# Patient Record
Sex: Male | Born: 2005 | Race: White | Hispanic: No | Marital: Single | Smoking: Never smoker
Health system: Southern US, Community
[De-identification: ages and names within clinical notes are randomized; demographics above are authoritative.]

## PROBLEM LIST (undated history)

## (undated) DIAGNOSIS — F89 Unspecified disorder of psychological development: Secondary | ICD-10-CM

## (undated) DIAGNOSIS — J45909 Unspecified asthma, uncomplicated: Secondary | ICD-10-CM

## (undated) DIAGNOSIS — G4700 Insomnia, unspecified: Secondary | ICD-10-CM

## (undated) DIAGNOSIS — T7840XA Allergy, unspecified, initial encounter: Secondary | ICD-10-CM

## (undated) DIAGNOSIS — F845 Asperger's syndrome: Secondary | ICD-10-CM

## (undated) HISTORY — DX: Allergy, unspecified, initial encounter: T78.40XA

## (undated) HISTORY — DX: Asperger's syndrome: F84.5

## (undated) HISTORY — DX: Insomnia, unspecified: G47.00

## (undated) HISTORY — DX: Unspecified disorder of psychological development: F89

## (undated) HISTORY — DX: Unspecified asthma, uncomplicated: J45.909

---
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
## 2023-02-02 ENCOUNTER — Other Ambulatory Visit: Payer: Self-pay

## 2023-02-02 ENCOUNTER — Encounter: Payer: Self-pay | Admitting: Emergency Medicine

## 2023-02-02 ENCOUNTER — Ambulatory Visit
Admission: EM | Admit: 2023-02-02 | Discharge: 2023-02-02 | Disposition: A | Discharge status: Home / Self Care | Payer: MEDICAID | Attending: Internal Medicine | Admitting: Internal Medicine

## 2023-02-02 DIAGNOSIS — R051 Acute cough: Secondary | ICD-10-CM | POA: Diagnosis not present

## 2023-02-02 DIAGNOSIS — J069 Acute upper respiratory infection, unspecified: Secondary | ICD-10-CM | POA: Diagnosis not present

## 2023-02-02 MED ORDER — AMOXICILLIN-POT CLAVULANATE 875-125 MG PO TABS: 1.0000 | ORAL_TABLET | Freq: Two times a day (BID) | ORAL | 0 refills | Status: DC

## 2023-02-02 NOTE — ED Triage Notes (Signed)
Pt here for nasal congestion, cough x 1 week

## 2023-02-02 NOTE — ED Provider Notes (Signed)
[facility]    CSN: [id number] Arrival date & time: 02/02/23  1126      History   Chief Complaint Chief Complaint  Patient presents with   Nasal Congestion    HPI Jesse Howe is a 17 y.o. male.   Patient presents with approximately 8-day history of nasal congestion and coughing.  Patient has taken Tylenol and allergy medicine for symptoms with minimal improvement.  His grandfather has similar symptoms.  Denies any fever.  Denies chest pain or shortness of breath.     Past Medical History:  Diagnosis Date   Allergy    RHINITIS   Asperger syndrome    sees psychiatry and counseling   Asthma    Asthma    Phreesia 08/16/2020   Developmental disorder    Insomnia     Patient Active Problem List   Diagnosis Date Noted   Rupture of anterior cruciate ligament of right knee    Acute lateral meniscus tear of right knee    Complete tear of right ACL, initial encounter 03/18/2021   Acute medial meniscus tear of right knee 03/18/2021   Abnormal finding on EKG 06/19/2019   Need for prophylactic vaccination and inoculation against influenza 08/09/2018   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   Rhinitis, allergic 02/21/2015   Asthma, moderate persistent 02/21/2015   Follow up 02/21/2015   Need for HPV vaccination 02/21/2015   Vaccine counseling 02/21/2015   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 Authorizing Provider  amoxicillin-clavulanate (AUGMENTIN) 875-125 MG tablet Take 1 tablet by mouth every 12 (twelve) hours. 02/02/23  Yes Ivie Maese, Acie Fredrickson, FNP  albuterol (PROAIR HFA) 108 (90 Base) MCG/ACT inhaler Inhale 2 puffs into the lungs every 6 (six) hours as needed for wheezing or shortness of breath. 08/29/21   Myles Gip, DO  albuterol (PROVENTIL) (2.5 MG/3ML) 0.083% nebulizer solution Take 3 mLs (2.5 mg total) by nebulization every 6 (six) hours as needed for wheezing or shortness of breath. 08/02/18   Myles Gip, DO  albuterol (VENTOLIN HFA) 108 (90 Base) MCG/ACT inhaler Inhale 2 puffs into the lungs every 6 (six) hours as needed for wheezing or shortness of breath. 06/19/19   Klett, Pascal Lux, NP  Carbinoxamine Maleate ER [facility]) 4 MG/5ML SUER Take 10 mLs by mouth 2 (two) times daily as needed. 02/17/21   Estelle June, NP  cetirizine (ZYRTEC) 10 MG tablet Take 1 tablet (10 mg total) by mouth at bedtime. Patient not taking: Reported on 08/02/2018 08/27/17   Tysinger, Kermit Balo, PA-C  fluticasone [facility]) 50 MCG/ACT nasal spray Place 2 sprays into both nostrils daily. 08/29/21   Myles Gip, DO  HYDROcodone-acetaminophen (NORCO/VICODIN) 5-325 MG tablet Take 1 tablet by mouth every 12 (twelve) hours as needed for moderate pain. Patient not taking: Reported on 06/22/2022 04/28/21   Cammy Copa, MD  methocarbamol (ROBAXIN) 500 MG tablet Take 1 tablet (500 mg total) by  mouth every 8 (eight) hours as needed. Patient not taking: Reported on 06/22/2022 04/07/21   Magnant, Joycie Peek, PA-C  Multiple Vitamin (MULTIVITAMIN) tablet Take 1 tablet by mouth daily. Reported on 09/30/2015    [provider]  Spacer/Aero-Holding Chambers (AEROCHAMBER PLUS) inhaler Use as instructed with inhaler use 08/10/18   Agbuya, Ines Bloomer, DO    Family History Family History  Problem Relation Age of Onset   Arthritis Paternal  Grandmother    Diabetes Paternal Grandmother    Depression Paternal Grandmother    Arthritis Paternal Grandfather    Diabetes Paternal Grandfather    Hypertension Paternal Grandfather    Arthritis Mother        rheumatoid   HIV Maternal Grandfather        Died in his 65's    Social History Social History   Tobacco Use   Smoking status: Never   Smokeless tobacco: Never  Substance Use Topics   Alcohol use: No   Drug use: No     Allergies   Patient has no known allergies.   Review of Systems Review of Systems Per HPI  Physical Exam Triage Vital Signs ED Triage Vitals [02/02/23 1138]  Encounter Vitals Group     BP 121/68     Systolic BP Percentile      Diastolic BP Percentile      Pulse Rate 64     Resp 18     Temp 97.6 F (36.4 C)     Temp Source Oral     SpO2 96 %     Weight      Height      Head Circumference      Peak Flow      Pain Score 0     Pain Loc      Pain Education      Exclude from Growth Chart    No data found.  Updated Vital Signs BP 121/68 (BP Location: Right Arm)   Pulse 64   Temp 97.6 F (36.4 C) (Oral)   Resp 18   SpO2 96%   Visual Acuity Right Eye Distance:   Left Eye Distance:   Bilateral Distance:    Right Eye Near:   Left Eye Near:    Bilateral Near:     Physical Exam Constitutional:      General: He is not in acute distress.    Appearance: Normal appearance. He is not toxic-appearing or diaphoretic.  HENT:     Head: Normocephalic and atraumatic.     Right Ear: Tympanic membrane and ear canal normal.     Left Ear: Tympanic membrane and ear canal normal.     Nose: Congestion present.     Mouth/Throat:     Mouth: Mucous membranes are moist.     Pharynx: No posterior oropharyngeal erythema.  Eyes:     Extraocular Movements: Extraocular movements intact.     Conjunctiva/sclera: Conjunctivae normal.     Pupils: Pupils are equal, round, and reactive to light.  Cardiovascular:     Rate and Rhythm: Normal rate and  regular rhythm.     Pulses: Normal pulses.     Heart sounds: Normal heart sounds.  Pulmonary:     Effort: Pulmonary effort is normal. No respiratory distress.     Breath sounds: Normal breath sounds. No stridor. No wheezing, rhonchi or rales.  Abdominal:     Palpations: Abdomen is soft.  Musculoskeletal:        General: Normal range of  motion.     Cervical back: Normal range of motion.  Skin:    General: Skin is warm and dry.  Neurological:     General: No focal deficit present.     Mental Status: He is alert and oriented to person, place, and time. Mental status is at baseline.  Psychiatric:        Mood and Affect: Mood normal.        Behavior: Behavior normal.      UC Treatments / Results  Labs (all labs ordered are listed, but only abnormal results are displayed) Labs Reviewed - No data to display  EKG   Radiology No results found.  Procedures Procedures (including critical care time)  Medications Ordered in UC Medications - No data to display  Initial Impression / Assessment and Plan / UC Course  I have reviewed the triage vital signs and the nursing notes.  Pertinent labs & imaging results that were available during my care of the patient were reviewed by me and considered in my medical decision making (see chart for details).     Symptoms most likely started off as a viral illness, but given duration of symptoms, I am concerned for secondary bacterial infection versus sinus infection.  Will opt to treat with Augmentin antibiotic today.  Advised supportive care and symptom management.  Advised strict follow-up precautions.  Patient verbalized understanding and was agreeable with plan.  Patient denies that he has a legal guardian. Final Clinical Impressions(s) / UC Diagnoses   Final diagnoses:  Acute upper respiratory infection  Acute cough     Discharge Instructions      I have sent you an antibiotic to treat upper respiratory infection.  Please  follow-up if any symptoms persist or worsen.    ED Prescriptions     Medication Sig Dispense Auth. Provider   amoxicillin-clavulanate (AUGMENTIN) 875-125 MG tablet Take 1 tablet by mouth every 12 (twelve) hours. 14 tablet [place], Acie Fredrickson, [place]      PDMP not reviewed this encounter.   Gustavus Bryant, [place] 02/02/23 1219

## 2023-02-02 NOTE — Discharge Instructions (Signed)
I have sent you an antibiotic to treat upper respiratory infection.  Please follow-up if any symptoms persist or worsen.

## 2023-02-04 ENCOUNTER — Ambulatory Visit: Payer: Medicaid Other | Admitting: Medical

## 2023-03-23 ENCOUNTER — Encounter: Payer: Self-pay | Admitting: Family Medicine

## 2023-03-23 ENCOUNTER — Ambulatory Visit: Payer: MEDICAID | Admitting: Family Medicine

## 2023-03-23 VITALS — BP 110/70 | HR 70 | Ht 74.0 in | Wt 166.8 lb

## 2023-03-23 DIAGNOSIS — M25462 Effusion, left knee: Secondary | ICD-10-CM | POA: Diagnosis not present

## 2023-03-23 DIAGNOSIS — R29898 Other symptoms and signs involving the musculoskeletal system: Secondary | ICD-10-CM | POA: Diagnosis not present

## 2023-03-23 NOTE — Progress Notes (Signed)
   Subjective:    Patient ID: Jesse Howe, male    DOB: 2005-09-02, 17 y.o.   MRN: [id number]  HPI He states that yesterday while playing basketball, he came down and felt a popping sensation and feeling as if his leg gave out laterally.  He did not play after that.  It did swell up.   Review of Systems     Objective:    Physical Exam Exam of the left knee does show an effusion.  No joint line tenderness noted.  Positive anterior drawer.  Lateral collateral ligament was slightly lax.  McMurray's testing negative.       Assessment & Plan:  Effusion of left knee - Plan: Ambulatory referral to Orthopedic Surgery  Positive anterior drawer test of knee joint - Plan: Ambulatory referral to Orthopedic Surgery I explained that since he had an acute effusion with positive anterior drawer and possible lateral collateral ligament damage, referral to orthopedics is appropriate.  He has seen Dr. August Saucer in the past for previous ACL repair on the right.  Recommend 2 Aleve twice per day and I will give him a note for out of work for the next 2 weeks.

## 2023-03-23 NOTE — Patient Instructions (Signed)
2 Aleve twice per day.  Ice for 20 minutes 3 times per day

## 2023-03-24 ENCOUNTER — Other Ambulatory Visit (INDEPENDENT_AMBULATORY_CARE_PROVIDER_SITE_OTHER): Payer: MEDICAID

## 2023-03-24 ENCOUNTER — Encounter: Payer: Self-pay | Admitting: Surgical

## 2023-03-24 ENCOUNTER — Ambulatory Visit: Payer: MEDICAID | Admitting: Surgical

## 2023-03-24 DIAGNOSIS — M25562 Pain in left knee: Secondary | ICD-10-CM

## 2023-03-24 DIAGNOSIS — M25462 Effusion, left knee: Secondary | ICD-10-CM | POA: Diagnosis not present

## 2023-03-24 NOTE — Progress Notes (Signed)
Office Visit Note   Patient: Jesse Howe           Date of Birth: 06-Jul-2005           MRN: [id number] Visit Date: 03/24/2023 Requested by: Ronnald Nian, MD [address] [place],  [place] [address] PCP: Ronnald Nian, MD  Subjective: Chief Complaint  Patient presents with   Left Knee - Pain    HPI: Jesse Howe is a 17 y.o. male who presents to the office reporting left knee pain.  Patient states that he was playing basketball 2 days ago.  He jumped up to grab a rebound and when he landed he landed only on his left leg initially.  He felt a pop and immediate pain primarily in the posterior medial aspect of the left knee.  Has never had issues with this knee before.  This pain caused him to sit out the rest of the game and he did not play basketball again.  Did not have any swelling at the time but did note some swelling the following morning.  States that he has been able to ambulate without too much difficulty though he is walking with a bit of a limp.  Does have some symptomatic instability and some stiffness.  Pain does not keep him up at night.  Not taking any medications for pain aside from Tylenol.  No history of prior surgery to this knee though he has had ACL reconstruction with quadricep autograft on the right knee.  He feels that the right knee is actually in a pretty good place after having a little bit of issues with it back in May 2024.  No mechanical symptoms in the knee..                ROS: All systems reviewed are negative as they relate to the chief complaint within the history of present illness.  Patient denies fevers or chills.  Assessment & Plan: Visit Diagnoses:  1. Effusion, left knee   2. Acute pain of left knee     Plan: Patient is a 17 year old male who presents for evaluation of left knee pain.  Has history of right knee ACL reconstruction with quadricep autograft several years ago.  This has been doing pretty well for him in the last several  months especially.  He played basketball 2 days ago and landed awkwardly on his left leg feeling a pop and now has a fairly large effusion.  Main differential diagnosis would be ACL tear as he had on his contralateral side or meniscal tear especially with the delayed onset of swelling that he did not notice until the next day.  Radiographs are negative for any significant abnormality.  Plan for further evaluation with MRI of the left knee and follow-up after MRI to review results with Dr. August Saucer.  Follow-Up Instructions: No follow-ups on file.   Orders:  Orders Placed This Encounter  Procedures   XR KNEE 3 VIEW LEFT   MR Knee Left w/o contrast   No orders of the defined types were placed in this encounter.     Procedures: No procedures performed   Clinical Data: No additional findings.  Objective: Vital Signs: There were no vitals taken for this visit.  Physical Exam:  Constitutional: Patient appears well-developed HEENT:  Head: Normocephalic Eyes:EOM are normal Neck: Normal range of motion Cardiovascular: Normal rate Pulmonary/chest: Effort normal Neurologic: Patient is alert Skin: Skin is warm Psychiatric: Patient has normal mood and affect  Ortho Exam: Ortho exam demonstrates left knee with moderate effusion.  No cellulitis or skin changes noted.  No calf tenderness.  Negative Homans' sign.  Able to perform straight leg raise without extensor lag.  He has mild to moderate tenderness over the medial joint line with positive McMurray's sign.  No tenderness over the lateral joint line.  No patellofemoral crepitus.  Has positive endpoint with anterior drawer and posterior drawer.  No laxity to MCL or LCL at 0 or 30 degrees.  Does have some mild laxity of the ACL on Lachman exam though there is still what feels to be an endpoint.  Patient guards a little bit through exam but overall tolerates exam pretty well.  He has range of motion from 0 degrees to 90 degrees comfortably and has  increased pain around 110 degrees of knee flexion.  Specialty Comments:  No specialty comments available.  Imaging: No results found.   PMFS History: Patient Active Problem List   Diagnosis Date Noted   Rupture of anterior cruciate ligament of right knee    Acute lateral meniscus tear of right knee    Complete tear of right ACL, initial encounter 03/18/2021   Acute medial meniscus tear of right knee 03/18/2021   Abnormal finding on EKG 06/19/2019   Need for prophylactic vaccination and inoculation against influenza 08/09/2018   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   Rhinitis, allergic 02/21/2015   Asthma, moderate persistent 02/21/2015   Encounter for follow-up 02/21/2015   Need for HPV vaccination 02/21/2015   Vaccine counseling 02/21/2015   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, requiring support (level 1) 12/07/2013   Developmental disorder 05/11/2013   Past Medical History:  Diagnosis Date   Allergy    RHINITIS   Asperger syndrome    sees psychiatry and counseling   Asthma    Asthma    Phreesia 08/16/2020   Developmental disorder    Insomnia     Family History  Problem Relation Age of Onset   Arthritis Paternal Grandmother    Diabetes Paternal Grandmother    Depression Paternal Grandmother    Arthritis Paternal Grandfather    Diabetes Paternal Grandfather    Hypertension Paternal Grandfather    Arthritis Mother        rheumatoid   HIV Maternal Grandfather        Died in his 32's    Past Surgical History:  Procedure Laterality Date   ANTERIOR CRUCIATE LIGAMENT REPAIR Right 04/07/2021   Procedure: RIGHT KNEE RECONSTRUCTION ANTERIOR CRUCIATE LIGAMENT (ACL) WITH QUADRICEPS AUTOGRAFT, LATERAL MENISCAL DEBRIDEMENT;  Surgeon: Cammy Copa, MD;  Location: [facility];  Service: Orthopedics;  Laterality: Right;   TONSILLECTOMY AND ADENOIDECTOMY Bilateral March 2011   Social History   Occupational History    Not on file  Tobacco Use   Smoking status: Never   Smokeless tobacco: Never  Substance and Sexual Activity   Alcohol use: No   Drug use: No   Sexual activity: Not on file

## 2023-03-29 ENCOUNTER — Ambulatory Visit
Admission: RE | Admit: 2023-03-29 | Discharge: 2023-03-29 | Disposition: A | Discharge status: Home / Self Care | Payer: MEDICAID | Source: Ambulatory Visit | Attending: Surgical

## 2023-03-29 DIAGNOSIS — M25562 Pain in left knee: Secondary | ICD-10-CM

## 2023-03-31 NOTE — Progress Notes (Signed)
Called and scheduled for 04/14/2023 at 10:30am.

## 2023-03-31 NOTE — Progress Notes (Signed)
Jesse Howe, can we have Jesse Howe come back to see Dr. August Saucer?

## 2023-04-14 ENCOUNTER — Ambulatory Visit (INDEPENDENT_AMBULATORY_CARE_PROVIDER_SITE_OTHER): Payer: MEDICAID | Admitting: Orthopedic Surgery

## 2023-04-14 DIAGNOSIS — S83512D Sprain of anterior cruciate ligament of left knee, subsequent encounter: Secondary | ICD-10-CM | POA: Diagnosis not present

## 2023-04-15 ENCOUNTER — Encounter (HOSPITAL_COMMUNITY): Payer: Self-pay | Admitting: Orthopedic Surgery

## 2023-04-15 ENCOUNTER — Other Ambulatory Visit: Payer: Self-pay

## 2023-04-15 NOTE — Progress Notes (Signed)
SDW CALL  Unable to reach the patient.  Emergency contact, patient's grandmother, was able to answer all questions appropriately and was provided instructions for DOS.    Patient's grandmother was given pre-op instructions over the phone. The opportunity was given for the patient's grandmother to ask questions. No further questions asked. Patient's grandmother verbalized understanding of instructions given.   PCP - Dr. Crosby Oyster Cardiologist - denies  PPM/ICD - denies Device Orders - n/a Rep Notified - n/a  Chest x-ray - denies EKG - denies Stress Test - denies ECHO - denies Cardiac Cath - denies  Sleep Study - denies CPAP - n/a  Fasting Blood Sugar - denies  Last dose of GLP1 agonist-  n/a GLP1 instructions: n/a  Blood Thinner Instructions: n/a Aspirin Instructions: n/a  ERAS Protcol - clears until 0415   COVID TEST- n/a   Anesthesia review: no  Patient's grandmother denies that the patient has shortness of breath, fever, cough and chest pain over the phone call   All instructions explained to the patient's grandmother, with a verbal understanding of the material. Patient agrees to go over the instructions while at home for a better understanding.

## 2023-04-17 NOTE — Progress Notes (Unsigned)
Office Visit Note   Patient: Jesse Howe           Date of Birth: 05-Dec-2005           MRN: [id number] Visit Date: 04/14/2023 Requested by: Ronnald Nian, MD [address] [place],  [place] [address] PCP: Ronnald Nian, MD  Subjective: Chief Complaint  Patient presents with   Other     Review MRI    HPI: Jesse Howe is a 17 y.o. male who presents to the office reporting left knee pain.  Patient sustained a noncontact injury playing basketball.  He has done well with his right knee ACL reconstruction using quadriceps autograft.  Does report some symptomatic instability in the knee.  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  MRI scan does show ACL tear but menisci appear to be intact.                ROS: All systems reviewed are negative as they relate to the chief complaint within the history of present illness.  Patient denies fevers or chills.  Assessment & Plan: Visit Diagnoses:  1. Rupture of anterior cruciate ligament of left knee, subsequent encounter     Plan: Impression is left knee ACL tear playing basketball with symptomatic instability and no meniscal damage.  Jesse Howe is young and he has done well with his right knee ACL reconstruction.  Plan at this time is left knee ACL reconstruction using quadriceps autograft.  Risk and benefits discussed with the patient include not limited to infection or vessel damage incomplete restoration of function as well as potential need for more surgery in his lifetime.  Patient understands risk and benefits and we will proceed sometime in the near future.  He does have adequate flexion and extension at this time now that he is about 3 weeks out from his injury.  Follow-Up Instructions: No follow-ups on file.   Orders:  No orders of the defined types were placed in this encounter.  No orders of the defined  types were placed in this encounter.     Procedures: No procedures performed   Clinical Data: No additional findings.  Objective: Vital Signs: There were no vitals taken for this visit.  Physical Exam:  Constitutional: Patient appears well-developed HEENT:  Head: Normocephalic Eyes:EOM are normal Neck: Normal range of motion Cardiovascular: Normal rate Pulmonary/chest: Effort normal Neurologic: Patient is alert Skin: Skin is warm Psychiatric: Patient has normal mood and affect  Ortho Exam: Ortho exam demonstrates positive Lachman positive anterior drawer on the left.  No endpoint.  No posterolateral rotatory instability.  Collateral ligaments are stable to varus and valgus stress at 0 and 30 degrees.  PCL intact.  Pedal pulses palpable.  Ankle dorsiflexion intact.  Range of motion is 0-1 30 with trace effusion.  No focal joint line tenderness is present  Specialty Comments:  No specialty comments available.  Imaging: No results found.   PMFS History: Patient Active Problem List   Diagnosis Date Noted   Rupture of anterior cruciate ligament of right knee    Acute lateral meniscus tear of right knee    Complete tear of right ACL, initial encounter 03/18/2021   Acute medial meniscus tear of right knee 03/18/2021   Abnormal finding on EKG 06/19/2019   Need for prophylactic vaccination and inoculation against influenza 08/09/2018   Depression in pediatric  patient 08/06/2018   Rhinitis, allergic 02/21/2015   Asthma, moderate persistent 02/21/2015   Encounter for follow-up 02/21/2015   Need for HPV vaccination 02/21/2015   Vaccine counseling 02/21/2015   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, requiring support (level 1) 12/07/2013   Developmental disorder 05/11/2013   Past Medical History:  Diagnosis Date   Allergy    RHINITIS   Asperger syndrome    sees psychiatry and counseling   Asthma    Asthma    Phreesia 08/16/2020    Developmental disorder    Insomnia     Family History  Problem Relation Age of Onset   Arthritis Paternal Grandmother    Diabetes Paternal Grandmother    Depression Paternal Grandmother    Arthritis Paternal Grandfather    Diabetes Paternal Grandfather    Hypertension Paternal Grandfather    Arthritis Mother        rheumatoid   HIV Maternal Grandfather        Died in his 29's    Past Surgical History:  Procedure Laterality Date   ANTERIOR CRUCIATE LIGAMENT REPAIR Right 04/07/2021   Procedure: RIGHT KNEE RECONSTRUCTION ANTERIOR CRUCIATE LIGAMENT (ACL) WITH QUADRICEPS AUTOGRAFT, LATERAL MENISCAL DEBRIDEMENT;  Surgeon: Cammy Copa, MD;  Location: [facility];  Service: Orthopedics;  Laterality: Right;   TONSILLECTOMY AND ADENOIDECTOMY Bilateral March 2011   Social History   Occupational History   Not on file  Tobacco Use   Smoking status: Never   Smokeless tobacco: Never  Substance and Sexual Activity   Alcohol use: No   Drug use: No   Sexual activity: Not on file

## 2023-04-18 ENCOUNTER — Encounter: Payer: Self-pay | Admitting: Orthopedic Surgery

## 2023-04-19 ENCOUNTER — Encounter (HOSPITAL_COMMUNITY)
Admission: RE | Disposition: A | Discharge status: Home / Self Care | Payer: Self-pay | Source: Home / Self Care | Attending: Orthopedic Surgery

## 2023-04-19 ENCOUNTER — Encounter (HOSPITAL_COMMUNITY): Payer: Self-pay | Admitting: Orthopedic Surgery

## 2023-04-19 ENCOUNTER — Ambulatory Visit (HOSPITAL_COMMUNITY): Payer: MEDICAID | Admitting: Anesthesiology

## 2023-04-19 ENCOUNTER — Ambulatory Visit (HOSPITAL_COMMUNITY): Payer: MEDICAID

## 2023-04-19 ENCOUNTER — Other Ambulatory Visit: Payer: Self-pay

## 2023-04-19 ENCOUNTER — Ambulatory Visit (HOSPITAL_COMMUNITY)
Admission: RE | Admit: 2023-04-19 | Discharge: 2023-04-19 | Disposition: A | Discharge status: Home / Self Care | Payer: MEDICAID | Attending: Orthopedic Surgery | Admitting: Orthopedic Surgery

## 2023-04-19 DIAGNOSIS — S83512A Sprain of anterior cruciate ligament of left knee, initial encounter: Secondary | ICD-10-CM

## 2023-04-19 DIAGNOSIS — X58XXXA Exposure to other specified factors, initial encounter: Secondary | ICD-10-CM | POA: Diagnosis not present

## 2023-04-19 DIAGNOSIS — Z01818 Encounter for other preprocedural examination: Secondary | ICD-10-CM

## 2023-04-19 DIAGNOSIS — F845 Asperger's syndrome: Secondary | ICD-10-CM | POA: Insufficient documentation

## 2023-04-19 DIAGNOSIS — Y9367 Activity, basketball: Secondary | ICD-10-CM | POA: Insufficient documentation

## 2023-04-19 DIAGNOSIS — J45909 Unspecified asthma, uncomplicated: Secondary | ICD-10-CM | POA: Diagnosis not present

## 2023-04-19 HISTORY — PX: ANTERIOR CRUCIATE LIGAMENT REPAIR: SHX115

## 2023-04-19 LAB — CBC
HCT: 48.1 % (ref 39.0–52.0)
Hemoglobin: 15.9 g/dL (ref 13.0–17.0)
MCH: 29.2 pg (ref 26.0–34.0)
MCHC: 33.1 g/dL (ref 30.0–36.0)
MCV: 88.3 fL (ref 80.0–100.0)
Platelets: 210 10*3/uL (ref 150–400)
RBC: 5.45 MIL/uL (ref 4.22–5.81)
RDW: 11.6 % (ref 11.5–15.5)
WBC: 4.8 10*3/uL (ref 4.0–10.5)
nRBC: 0 % (ref 0.0–0.2)

## 2023-04-19 SURGERY — RECONSTRUCTION, KNEE, ACL, USING HAMSTRING GRAFT
Anesthesia: Regional | Site: Knee | Laterality: Left

## 2023-04-19 MED ORDER — LIDOCAINE 2% (20 MG/ML) 5 ML SYRINGE
INTRAMUSCULAR | Status: DC | PRN
  Administered 2023-04-19: 60 mg via INTRAVENOUS

## 2023-04-19 MED ORDER — CLONIDINE HCL (ANALGESIA) 100 MCG/ML EP SOLN
EPIDURAL | Status: DC | PRN
  Administered 2023-04-19: 50 ug

## 2023-04-19 MED ORDER — EPINEPHRINE PF 1 MG/ML IJ SOLN
INTRAMUSCULAR | Status: AC
  Filled 2023-04-19: qty 1

## 2023-04-19 MED ORDER — LACTATED RINGERS IV SOLN: INTRAVENOUS | Status: DC | PRN

## 2023-04-19 MED ORDER — METHOCARBAMOL 500 MG PO TABS: 500.0000 mg | ORAL_TABLET | Freq: Three times a day (TID) | ORAL | 1 refills | Status: DC | PRN

## 2023-04-19 MED ORDER — PROPOFOL 10 MG/ML IV BOLUS
INTRAVENOUS | Status: DC | PRN
  Administered 2023-04-19: 170 mg via INTRAVENOUS

## 2023-04-19 MED ORDER — OXYCODONE HCL 5 MG PO TABS
ORAL_TABLET | ORAL | Status: AC
  Filled 2023-04-19: qty 1

## 2023-04-19 MED ORDER — MORPHINE SULFATE (PF) 4 MG/ML IV SOLN
INTRAVENOUS | Status: AC
  Filled 2023-04-19: qty 2

## 2023-04-19 MED ORDER — OXYCODONE HCL 5 MG PO TABS
5.0000 mg | ORAL_TABLET | Freq: Once | ORAL | Status: AC | PRN
  Administered 2023-04-19: 5 mg via ORAL

## 2023-04-19 MED ORDER — DEXAMETHASONE SODIUM PHOSPHATE 10 MG/ML IJ SOLN
INTRAMUSCULAR | Status: DC | PRN
  Administered 2023-04-19: 10 mg via INTRAVENOUS

## 2023-04-19 MED ORDER — FENTANYL CITRATE (PF) 100 MCG/2ML IJ SOLN: 25.0000 ug | INTRAMUSCULAR | Status: DC | PRN

## 2023-04-19 MED ORDER — FENTANYL CITRATE (PF) 100 MCG/2ML IJ SOLN
INTRAMUSCULAR | Status: AC
  Filled 2023-04-19: qty 2

## 2023-04-19 MED ORDER — 0.9 % SODIUM CHLORIDE (POUR BTL) OPTIME
TOPICAL | Status: DC | PRN
  Administered 2023-04-19: 1000 mL

## 2023-04-19 MED ORDER — AMISULPRIDE (ANTIEMETIC) 5 MG/2ML IV SOLN: 10.0000 mg | Freq: Once | INTRAVENOUS | Status: DC | PRN

## 2023-04-19 MED ORDER — BUPIVACAINE HCL 0.25 % IJ SOLN
INTRAMUSCULAR | Status: DC | PRN
  Administered 2023-04-19: 20 mL

## 2023-04-19 MED ORDER — FENTANYL CITRATE (PF) 100 MCG/2ML IJ SOLN
25.0000 ug | INTRAMUSCULAR | Status: DC | PRN
  Administered 2023-04-19: 25 ug via INTRAVENOUS

## 2023-04-19 MED ORDER — OXYCODONE HCL 5 MG/5ML PO SOLN: 5.0000 mg | Freq: Once | ORAL | Status: AC | PRN

## 2023-04-19 MED ORDER — MORPHINE SULFATE (PF) 4 MG/ML IV SOLN
INTRAVENOUS | Status: DC | PRN
  Administered 2023-04-19: 8 mg via SUBCUTANEOUS

## 2023-04-19 MED ORDER — BUPIVACAINE HCL (PF) 0.25 % IJ SOLN
INTRAMUSCULAR | Status: AC
Start: 2023-04-19 — End: ?
  Filled 2023-04-19: qty 30

## 2023-04-19 MED ORDER — DIPHENHYDRAMINE HCL 50 MG/ML IJ SOLN
INTRAMUSCULAR | Status: DC | PRN
  Administered 2023-04-19: 50 mg via INTRAVENOUS

## 2023-04-19 MED ORDER — VANCOMYCIN HCL 1000 MG IV SOLR
INTRAVENOUS | Status: DC | PRN
Start: 2023-04-19 — End: 2023-04-19
  Administered 2023-04-19: 1000 mg via TOPICAL

## 2023-04-19 MED ORDER — ONDANSETRON HCL 4 MG/2ML IJ SOLN
INTRAMUSCULAR | Status: DC | PRN
  Administered 2023-04-19: 4 mg via INTRAVENOUS

## 2023-04-19 MED ORDER — FENTANYL CITRATE (PF) 250 MCG/5ML IJ SOLN
INTRAMUSCULAR | Status: AC
  Filled 2023-04-19: qty 5

## 2023-04-19 MED ORDER — POVIDONE-IODINE 7.5 % EX SOLN: Freq: Once | CUTANEOUS | Status: DC

## 2023-04-19 MED ORDER — CLONIDINE HCL (ANALGESIA) 100 MCG/ML EP SOLN
EPIDURAL | Status: DC | PRN
  Administered 2023-04-19: 1 mL

## 2023-04-19 MED ORDER — DEXAMETHASONE SODIUM PHOSPHATE 10 MG/ML IJ SOLN
INTRAMUSCULAR | Status: AC
  Filled 2023-04-19: qty 1

## 2023-04-19 MED ORDER — TRANEXAMIC ACID-NACL 1000-0.7 MG/100ML-% IV SOLN
1000.0000 mg | INTRAVENOUS | Status: AC
  Administered 2023-04-19: 1000 mg via INTRAVENOUS
  Filled 2023-04-19: qty 100

## 2023-04-19 MED ORDER — CELECOXIB 100 MG PO CAPS: 100.0000 mg | ORAL_CAPSULE | Freq: Two times a day (BID) | ORAL | 0 refills | Status: DC

## 2023-04-19 MED ORDER — PROPOFOL 10 MG/ML IV BOLUS
INTRAVENOUS | Status: AC
  Filled 2023-04-19: qty 20

## 2023-04-19 MED ORDER — MIDAZOLAM HCL 2 MG/2ML IJ SOLN
INTRAMUSCULAR | Status: DC | PRN
  Administered 2023-04-19: 2 mg via INTRAVENOUS

## 2023-04-19 MED ORDER — OXYCODONE HCL 5 MG PO TABS: 5.0000 mg | ORAL_TABLET | ORAL | 0 refills | Status: DC | PRN

## 2023-04-19 MED ORDER — SODIUM CHLORIDE 0.9 % IR SOLN
Status: DC | PRN
  Administered 2023-04-19: 15000 mL

## 2023-04-19 MED ORDER — ASPIRIN 81 MG PO CHEW: 81.0000 mg | CHEWABLE_TABLET | Freq: Two times a day (BID) | ORAL | 0 refills | Status: AC

## 2023-04-19 MED ORDER — CEFAZOLIN SODIUM-DEXTROSE 2-4 GM/100ML-% IV SOLN
2.0000 g | INTRAVENOUS | Status: AC
  Administered 2023-04-19: 2 g via INTRAVENOUS
  Filled 2023-04-19: qty 100

## 2023-04-19 MED ORDER — VANCOMYCIN HCL 1000 MG IV SOLR
INTRAVENOUS | Status: AC
  Filled 2023-04-19: qty 20

## 2023-04-19 MED ORDER — BUPIVACAINE-EPINEPHRINE (PF) 0.25% -1:200000 IJ SOLN
INTRAMUSCULAR | Status: AC
Start: 2023-04-19 — End: ?
  Filled 2023-04-19: qty 30

## 2023-04-19 MED ORDER — ROPIVACAINE HCL 5 MG/ML IJ SOLN
INTRAMUSCULAR | Status: DC | PRN
  Administered 2023-04-19: 20 mL via PERINEURAL

## 2023-04-19 MED ORDER — CLONIDINE HCL (ANALGESIA) 100 MCG/ML EP SOLN
EPIDURAL | Status: AC
  Filled 2023-04-19: qty 10

## 2023-04-19 MED ORDER — POVIDONE-IODINE 10 % EX SWAB
2.0000 | Freq: Once | CUTANEOUS | Status: AC
  Administered 2023-04-19: 2 via TOPICAL

## 2023-04-19 MED ORDER — PROPOFOL 1000 MG/100ML IV EMUL
INTRAVENOUS | Status: AC
Start: 2023-04-19 — End: ?
  Filled 2023-04-19: qty 100

## 2023-04-19 MED ORDER — DIPHENHYDRAMINE HCL 50 MG/ML IJ SOLN
INTRAMUSCULAR | Status: AC
  Filled 2023-04-19: qty 1

## 2023-04-19 MED ORDER — POVIDONE-IODINE 10 % EX SWAB: 2.0000 | Freq: Once | CUTANEOUS | Status: DC

## 2023-04-19 MED ORDER — EPINEPHRINE PF 1 MG/ML IJ SOLN
INTRAMUSCULAR | Status: AC
Start: 2023-04-19 — End: ?
  Filled 2023-04-19: qty 4

## 2023-04-19 MED ORDER — CHLORHEXIDINE GLUCONATE 0.12 % MT SOLN
15.0000 mL | Freq: Once | OROMUCOSAL | Status: AC
  Administered 2023-04-19: 15 mL via OROMUCOSAL
  Filled 2023-04-19: qty 15

## 2023-04-19 MED ORDER — VANCOMYCIN HCL IN DEXTROSE 1-5 GM/200ML-% IV SOLN
1000.0000 mg | Freq: Once | INTRAVENOUS | Status: DC
  Filled 2023-04-19: qty 200

## 2023-04-19 MED ORDER — ACETAMINOPHEN 500 MG PO TABS
1000.0000 mg | ORAL_TABLET | Freq: Once | ORAL | Status: AC
  Administered 2023-04-19: 1000 mg via ORAL
  Filled 2023-04-19: qty 2

## 2023-04-19 MED ORDER — SODIUM CHLORIDE (PF) 0.9 % IJ SOLN
INTRAMUSCULAR | Status: DC | PRN
  Administered 2023-04-19: 10 mL via INTRAMUSCULAR

## 2023-04-19 MED ORDER — FENTANYL CITRATE (PF) 250 MCG/5ML IJ SOLN
INTRAMUSCULAR | Status: DC | PRN
  Administered 2023-04-19 (×3): 25 ug via INTRAVENOUS
  Administered 2023-04-19: 75 ug via INTRAVENOUS
  Administered 2023-04-19: 50 ug via INTRAVENOUS

## 2023-04-19 MED ORDER — MIDAZOLAM HCL 2 MG/2ML IJ SOLN
INTRAMUSCULAR | Status: AC
  Filled 2023-04-19: qty 2

## 2023-04-19 SURGICAL SUPPLY — 84 items
ALCOHOL 70% 16 OZ (MISCELLANEOUS) ×1 IMPLANT
BAG COUNTER SPONGE SURGICOUNT (BAG) ×1 IMPLANT
BANDAGE ESMARK 6X9 LF (GAUZE/BANDAGES/DRESSINGS) IMPLANT
BLADE SURG 10 STRL SS (BLADE) ×1 IMPLANT
BLADE SURG 15 STRL LF DISP TIS (BLADE) ×2 IMPLANT
BLADE SURG 15 STRL SS (BLADE) ×1
BNDG ELASTIC 6X15 VLCR STRL LF (GAUZE/BANDAGES/DRESSINGS) ×1 IMPLANT
BNDG ESMARK 6X9 LF (GAUZE/BANDAGES/DRESSINGS) ×1
BURR OVAL 8 FLU 4.0X13 (MISCELLANEOUS) IMPLANT
COVER MAYO STAND STRL (DRAPES) ×1 IMPLANT
COVER SURGICAL LIGHT HANDLE (MISCELLANEOUS) ×1 IMPLANT
CUFF TOURN SGL QUICK 34 (TOURNIQUET CUFF) ×1
CUFF TRNQT CYL 34X4.125X (TOURNIQUET CUFF) IMPLANT
DISSECTOR 4.0MM X 13CM (MISCELLANEOUS) ×1 IMPLANT
DRAPE ARTHROSCOPY W/POUCH 114 (DRAPES) ×1 IMPLANT
DRAPE INCISE IOBAN 66X45 STRL (DRAPES) ×1 IMPLANT
DRAPE OEC MINIVIEW 54X84 (DRAPES) IMPLANT
DRAPE SURG ORHT 6 SPLT 77X108 (DRAPES) ×1 IMPLANT
DRAPE U-SHAPE 47X51 STRL (DRAPES) ×1 IMPLANT
DRILL FLIPCUTTER III 6-12 (ORTHOPEDIC DISPOSABLE SUPPLIES) IMPLANT
DRSG TEGADERM 4X4.75 (GAUZE/BANDAGES/DRESSINGS) ×4 IMPLANT
DRSG XEROFORM 1X8 (GAUZE/BANDAGES/DRESSINGS) IMPLANT
DURAPREP 26ML APPLICATOR (WOUND CARE) ×2 IMPLANT
DW OUTFLOW CASSETTE/TUBE SET (MISCELLANEOUS) ×1 IMPLANT
ELECT REM PT RETURN 9FT ADLT (ELECTROSURGICAL) ×1
ELECTRODE REM PT RTRN 9FT ADLT (ELECTROSURGICAL) ×1 IMPLANT
FLIPCUTTER III 6-12 AR-1204FF (ORTHOPEDIC DISPOSABLE SUPPLIES) ×2
GAUZE PAD ABD 8X10 STRL (GAUZE/BANDAGES/DRESSINGS) ×1 IMPLANT
GAUZE SPONGE 4X4 12PLY STRL LF (GAUZE/BANDAGES/DRESSINGS) ×1 IMPLANT
GAUZE XEROFORM 1X8 LF (GAUZE/BANDAGES/DRESSINGS) ×2 IMPLANT
GLOVE BIOGEL PI IND STRL 8 (GLOVE) ×1 IMPLANT
GLOVE ECLIPSE 8.0 STRL XLNG CF (GLOVE) ×1 IMPLANT
GLOVE ORTHO TXT STRL SZ7.5 (GLOVE) ×1 IMPLANT
GOWN STRL REUS W/ TWL LRG LVL3 (GOWN DISPOSABLE) ×3 IMPLANT
GOWN STRL REUS W/TWL LRG LVL3 (GOWN DISPOSABLE) ×3
IMMOBILIZER KNEE 22 UNIV (SOFTGOODS) ×1 IMPLANT
IMP SYS 2ND FIX PEEK 4.75X19.1 (Miscellaneous) ×1 IMPLANT
IMPL QUADLINK SYSTEM 9 (Orthopedic Implant) IMPLANT
IMPL SYS 2ND FX PEEK 4.75X19.1 (Miscellaneous) IMPLANT
IMPLANT QUADLINK SYSTEM 9 (Orthopedic Implant) ×1 IMPLANT
KIT BASIN OR (CUSTOM PROCEDURE TRAY) ×1 IMPLANT
KIT BIOCARTILAGE DEL W/SYRINGE (KITS) IMPLANT
KIT BIOCARTILAGE LG JOINT MIX (KITS) ×1 IMPLANT
KIT BUTTON TIGHTROPE ABS 8X12 (Anchor) IMPLANT
KIT TURNOVER KIT B (KITS) ×1 IMPLANT
MANIFOLD NEPTUNE II (INSTRUMENTS) ×1 IMPLANT
NDL 18GX1X1/2 (RX/OR ONLY) (NEEDLE) ×1 IMPLANT
NDL HYPO 18GX1.5 BLUNT FILL (NEEDLE) ×1 IMPLANT
NEEDLE 18GX1X1/2 (RX/OR ONLY) (NEEDLE) ×1 IMPLANT
NEEDLE HYPO 18GX1.5 BLUNT FILL (NEEDLE) ×1 IMPLANT
NS IRRIG 1000ML POUR BTL (IV SOLUTION) ×1 IMPLANT
PACK ARTHROSCOPY DSU (CUSTOM PROCEDURE TRAY) ×1 IMPLANT
PAD ARMBOARD 7.5X6 YLW CONV (MISCELLANEOUS) ×2 IMPLANT
PAD CAST 4YDX4 CTTN HI CHSV (CAST SUPPLIES) ×1 IMPLANT
PADDING CAST COTTON 4X4 STRL (CAST SUPPLIES)
PADDING CAST COTTON 6X4 STRL (CAST SUPPLIES) ×2 IMPLANT
PENCIL BUTTON HOLSTER BLD 10FT (ELECTRODE) ×1 IMPLANT
PUTTY DBM ALLOSYNC PURE 10CC (Putty) IMPLANT
SPIKE FLUID TRANSFER (MISCELLANEOUS) ×1 IMPLANT
SPONGE T-LAP 18X18 ~~LOC~~+RFID (SPONGE) ×1 IMPLANT
SPONGE T-LAP 4X18 ~~LOC~~+RFID (SPONGE) ×2 IMPLANT
STRIP CLOSURE SKIN 1/2X4 (GAUZE/BANDAGES/DRESSINGS) ×1 IMPLANT
SUCTION TUBE FRAZIER 10FR DISP (SUCTIONS) ×1 IMPLANT
SUT ETHILON 3 0 PS 1 (SUTURE) ×2 IMPLANT
SUT MNCRL AB 3-0 PS2 18 (SUTURE) ×1 IMPLANT
SUT VIC AB 0 CT1 27 (SUTURE) ×3
SUT VIC AB 0 CT1 27XBRD ANBCTR (SUTURE) ×1 IMPLANT
SUT VIC AB 1 CT1 27 (SUTURE) ×2
SUT VIC AB 1 CT1 27XBRD ANBCTR (SUTURE) IMPLANT
SUT VIC AB 2-0 CT1 27 (SUTURE)
SUT VIC AB 2-0 CT1 TAPERPNT 27 (SUTURE) ×1 IMPLANT
SUT VIC AB 2-0 CT2 27 (SUTURE) IMPLANT
SUT VIC AB CT1 27XBRD ANBCTRL (SUTURE) ×3
SUT VICRYL 0 UR6 27IN ABS (SUTURE) ×1 IMPLANT
SYR 30ML LL (SYRINGE) ×1 IMPLANT
SYR 3ML LL SCALE MARK (SYRINGE) ×1 IMPLANT
SYR BULB IRRIG 60ML STRL (SYRINGE) ×1 IMPLANT
SYR TB 1ML LUER SLIP (SYRINGE) ×1 IMPLANT
TOWEL GREEN STERILE (TOWEL DISPOSABLE) ×1 IMPLANT
TOWEL GREEN STERILE FF (TOWEL DISPOSABLE) ×1 IMPLANT
TUBING ARTHROSCOPY IRRIG 16FT (MISCELLANEOUS) ×1 IMPLANT
UNDERPAD 30X36 HEAVY ABSORB (UNDERPADS AND DIAPERS) ×1 IMPLANT
WRAP KNEE MAXI GEL POST OP (GAUZE/BANDAGES/DRESSINGS) ×1 IMPLANT
YANKAUER SUCT BULB TIP NO VENT (SUCTIONS) ×1 IMPLANT

## 2023-04-19 NOTE — H&P (Signed)
Jesse Howe is an 17 y.o. male.   Chief Complaint: left knee pain HPI: Jesse Howe is a 17 y.o. male who presents to the office reporting left knee pain.  Patient sustained a noncontact injury playing basketball.  He has done well with his right knee ACL reconstruction using quadriceps autograft.  Does report some symptomatic instability in the knee.  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   MRI scan does show ACL tear but menisci appear to be intact.    Past Medical History:  Diagnosis Date   Allergy    RHINITIS   Asperger syndrome    sees psychiatry and counseling   Asthma    Phreesia 08/16/2020   Developmental disorder    Insomnia     Past Surgical History:  Procedure Laterality Date   ANTERIOR CRUCIATE LIGAMENT REPAIR Right 04/07/2021   Procedure: RIGHT KNEE RECONSTRUCTION ANTERIOR CRUCIATE LIGAMENT (ACL) WITH QUADRICEPS AUTOGRAFT, LATERAL MENISCAL DEBRIDEMENT;  Surgeon: Cammy Copa, MD;  Location: [facility];  Service: Orthopedics;  Laterality: Right;   TONSILLECTOMY AND ADENOIDECTOMY Bilateral March 2011    Family History  Problem Relation Age of Onset   Arthritis Paternal Grandmother    Diabetes Paternal Grandmother    Depression Paternal Grandmother    Arthritis Paternal Grandfather    Diabetes Paternal Grandfather    Hypertension Paternal Grandfather    Arthritis Mother        rheumatoid   HIV Maternal Grandfather        Died in his 38's   Social History:  reports that he has never smoked. He has never used smokeless tobacco. He reports that he does not drink alcohol and does not use drugs.  Allergies: No Known Allergies  Medications Prior to Admission  Medication Sig Dispense Refill   acetaminophen (TYLENOL) 500 MG tablet Take 500-1,000 mg by mouth every 6 (six) hours as needed (pain.).     albuterol (PROAIR HFA)  108 (90 Base) MCG/ACT inhaler Inhale 2 puffs into the lungs every 6 (six) hours as needed for wheezing or shortness of breath. 6.7 g 2   albuterol (PROVENTIL) (2.5 MG/3ML) 0.083% nebulizer solution Take 3 mLs (2.5 mg total) by nebulization every 6 (six) hours as needed for wheezing or shortness of breath. 75 mL 1   melatonin 5 MG TABS Take 10 mg by mouth at bedtime as needed (as needed).      No results found for this or any previous visit (from the past 48 hour(s)). No results found.  Review of Systems  Musculoskeletal:  Positive for arthralgias.  All other systems reviewed and are negative.   Blood pressure (!) 141/96, pulse 73, temperature 97.8 F (36.6 C), resp. rate 18, height 6\' 2"  (1.88 m), weight 74.4 kg, SpO2 100%. Physical Exam Vitals reviewed.  HENT:     Head: Normocephalic.     Nose: Nose normal.     Mouth/Throat:     Mouth: Mucous membranes are moist.  Cardiovascular:     Rate and Rhythm: Normal rate.     Pulses: Normal pulses.  Pulmonary:     Effort: Pulmonary effort is normal.  Abdominal:     General: Abdomen is flat.  Musculoskeletal:     Cervical back: Normal range of motion.  Skin:    General: Skin is warm.     Capillary Refill: Capillary  refill takes less than 2 seconds.  Neurological:     General: No focal deficit present.     Mental Status: He is alert.  Psychiatric:        Mood and Affect: Mood normal.    Ortho exam demonstrates positive Lachman positive anterior drawer on the left. No endpoint. No posterolateral rotatory instability. Collateral ligaments are stable to varus and valgus stress at 0 and 30 degrees. PCL intact. Pedal pulses palpable. Ankle dorsiflexion intact. Range of motion is 0-1 30 with trace effusion. No focal joint line tenderness is present   Assessment/Plan Impression is left knee ACL tear playing basketball with symptomatic instability and no meniscal damage. Jesse Howe is young and he has done well with his right knee ACL  reconstruction. Plan at this time is left knee ACL reconstruction using quadriceps autograft. Risk and benefits discussed with the patient include not limited to infection or vessel damage incomplete restoration of function as well as potential need for more surgery in his lifetime. Patient understands risk and benefits and we will proceed sometime in the near future. He does have adequate flexion and extension at this time now that he is about 3 weeks out from his injury.   Burnard Bunting, MD 04/19/2023, 6:46 AM

## 2023-04-19 NOTE — Anesthesia Procedure Notes (Signed)
Anesthesia Regional Block: Adductor canal block   Pre-Anesthetic Checklist: , timeout performed,  Correct Patient, Correct Site, Correct Laterality,  Correct Procedure, Correct Position, site marked,  Risks and benefits discussed,  Surgical consent,  Pre-op evaluation,  At surgeon's request and post-op pain management  Laterality: Left  Prep: chloraprep       Needles:  Injection technique: Single-shot  Needle Type: Echogenic Needle     Needle Length: 9cm  Needle Gauge: 21     Additional Needles:   Procedures:,,,, ultrasound used (permanent image in chart),,    Narrative:  Start time: 04/19/2023 7:04 AM End time: 04/19/2023 7:09 AM Injection made incrementally with aspirations every 5 mL.  Performed by: Personally  Anesthesiologist: Marcene Duos, MD

## 2023-04-19 NOTE — Progress Notes (Signed)
During handoff to surgery, patient stated he felt itchy. Vanc was stopped and RN notified Dr. August Saucer in Room OR 09. Patient received Benadryl and Decadron.

## 2023-04-19 NOTE — Anesthesia Preprocedure Evaluation (Addendum)
Anesthesia Evaluation  Patient identified by MRN, date of birth, ID band Patient awake    Reviewed: Allergy & Precautions, H&P , NPO status , Patient's Chart, lab work & pertinent test results  Airway Mallampati: II   Neck ROM: full    Dental   Pulmonary asthma    breath sounds clear to auscultation       Cardiovascular negative cardio ROS  Rhythm:regular Rate:Normal     Neuro/Psych  PSYCHIATRIC DISORDERS  Depression    Dev delay, asbergers negative neurological ROS     GI/Hepatic negative GI ROS, Neg liver ROS,,,  Endo/Other  negative endocrine ROS    Renal/GU negative Renal ROS     Musculoskeletal negative musculoskeletal ROS (+)    Abdominal   Peds  Hematology negative hematology ROS (+)   Anesthesia Other Findings   Reproductive/Obstetrics                             Anesthesia Physical Anesthesia Plan  ASA: 2  Anesthesia Plan: General and Regional   Post-op Pain Management: GA combined w/ Regional for post-op pain   Induction: Intravenous  PONV Risk Score and Plan: 2 and Ondansetron, Dexamethasone, Midazolam and Treatment may vary due to age or medical condition  Airway Management Planned: LMA  Additional Equipment: None  Intra-op Plan:   Post-operative Plan: Extubation in OR  Informed Consent: I have reviewed the patients History and Physical, chart, labs and discussed the procedure including the risks, benefits and alternatives for the proposed anesthesia with the patient or authorized representative who has indicated his/her understanding and acceptance.     Dental advisory given  Plan Discussed with: Anesthesiologist  Anesthesia Plan Comments:         Anesthesia Quick Evaluation

## 2023-04-19 NOTE — Transfer of Care (Signed)
Immediate Anesthesia Transfer of Care Note  Patient: Jesse Howe  Procedure(s) Performed: LEFT KNEE ARTHROSCOPY ANTERIOR CRUCIATE LIGAMENT (ACL) RECONSTRUCTION WITH QUADRICEP GRAFT (Left: Knee)  Patient Location: PACU  Anesthesia Type:General and Regional  Level of Consciousness: drowsy  Airway & Oxygen Therapy: Patient Spontanous Breathing and Patient connected to face mask oxygen  Post-op Assessment: Report given to RN and Post -op Vital signs reviewed and stable  Post vital signs: Reviewed and stable  Last Vitals:  Vitals Value Taken Time  BP 126/79 04/19/23 1045  Temp 36.7 C 04/19/23 1042  Pulse 61 04/19/23 1049  Resp 17 04/19/23 1049  SpO2 97 % 04/19/23 1049  Vitals shown include unfiled device data.  Last Pain:  Vitals:   04/19/23 0622  PainSc: 0-No pain         Complications: No notable events documented.

## 2023-04-19 NOTE — Anesthesia Postprocedure Evaluation (Signed)
Anesthesia Post Note  Patient: Jesse Howe  Procedure(s) Performed: LEFT KNEE ARTHROSCOPY ANTERIOR CRUCIATE LIGAMENT (ACL) RECONSTRUCTION WITH QUADRICEP GRAFT (Left: Knee)     Patient location during evaluation: PACU Anesthesia Type: General Level of consciousness: awake and alert Pain management: pain level controlled Vital Signs Assessment: post-procedure vital signs reviewed and stable Respiratory status: spontaneous breathing, nonlabored ventilation, respiratory function stable and patient connected to nasal cannula oxygen Cardiovascular status: blood pressure returned to baseline and stable Postop Assessment: no apparent nausea or vomiting Anesthetic complications: no  No notable events documented.  Last Vitals:  Vitals:   04/19/23 1130 04/19/23 1144  BP: (!) 131/90   Pulse: (!) 52 66  Resp: 12 11  Temp: 36.7 C   SpO2: 96% 94%    Last Pain:  Vitals:   04/19/23 1144  PainSc: 6                  Kennieth Rad

## 2023-04-19 NOTE — Op Note (Signed)
[name], Jesse Howe MEDICAL RECORD NO: [id number] ACCOUNT NO: [id number] DATE OF BIRTH: 2005/06/22 FACILITY: [facility] LOCATION: [facility] PHYSICIAN: Graylin Shiver. August Saucer, MD  Operative Report   DATE OF PROCEDURE: 04/19/2023  PREOPERATIVE DIAGNOSIS:  Left knee ACL tear.  POSTOPERATIVE DIAGNOSIS:  Left knee ACL tear.  PROCEDURE:  Left knee ACL reconstruction using quadriceps autograft.  SURGEON:  Graylin Shiver. August Saucer, MD  ASSISTANT:  Karenann Cai, PA.  INDICATIONS:  This is a 17 year old patient who is 3 weeks out from left knee ACL tear.  He reports symptomatic instability.  He did well with his right knee ACL reconstruction.  He presents now for left knee ACL reconstruction using quadriceps  autograft.  DESCRIPTION OF PROCEDURE:  The patient was brought to the operating room where general anesthetic was induced.  Preoperative antibiotics was administered.  Timeout was called.  Left leg was prescrubbed with alcohol and Betadine and allowed to air dry,  prepped with DuraPrep solution and draped in a sterile manner.  Jesse Howe was used to cover the operative field.  After calling a timeout, the leg was elevated and exsanguinated using the Esmarch wrap.  Tourniquet was inflated.  Incision was made from the  superior pole of the patella approximately about 6 cm.  Skin and subcutaneous tissue was sharply divided.  The quad tendon was exposed.  Using a double wide 9 mm harvesting knife, a 75-mm length tendon autograft was harvested and prepared on the back  table by Jesse Howe using Arthrex Dual EndoButton technique.  Graft wrapped in a sponge as well, soaked with vancomycin.  Concurrent with graft harvesting, the incision was irrigated and the quadriceps was closed side-to-side using #1 Vicryl suture  followed by interrupted inverted 0 Vicryl suture, 2-0 Vicryl sutures, and a Monocryl was placed at the end of the case.  The anterior inferolateral and anterior inferomedial portals were established.  Diagnostic  arthroscopy was performed.  The patient  had intact medial meniscus and articular cartilage, intact lateral meniscus and articular cartilage, intact patellofemoral joint.  No loose bodies in the medial or lateral gutter.  At this time, the ACL was seen to be torn.  PCL was intact.  This was  compatible with his examination under anesthesia, which demonstrated full extension, full flexion, good stability to varus and valgus stress at 0 and 30 degrees along with no posterolateral rotatory instability.  The patient did have positive Lachman,  positive anterior drawer with essentially no endpoint after 5 mm anterior translation.  The ACL was torn.  The stump was debrided using a shaver.  Notchplasty was performed.  Over-the-top position was identified.  Next, the FlipCutter was used to drill a  hole into the lateral femoral condyle going outside in and the 3 o'clock position.  The tunnel size was 9.5 on the femur and 10 on the tibia.  The FlipCutter did break.  The portion that broke was the blade, which was removed through the anteromedial  portal.  Fluoroscopy demonstrated no other metal fragments.  Separate FlipCutter was then used and the tunnel was drilled.  Tibial tunnel was then drilled in the posterior aspect of the native ACL footprint.  Graft was passed into tunnels prepared with  bone graft.  Secured on the femoral side under fluoroscopic guidance using EndoButton.  Cycling was performed prior to fixation on the tibial side.  The tibial side was fixed in full extension.  This was done also with the EndoButton.  Supplemental  fixation of the suture as well as  the internal brace was then performed with a SwiveLock.  All-in-all secure fixation was achieved.  Thorough irrigation was performed.  Instruments were removed and the portals were closed using 2-0 Vicryl and 3-0 nylon.   The site for the tibial tunnel drilling was closed using 0 Vicryl suture, 2-0 Vicryl suture, and 3-0 Monocryl.  The superior  harvest site incision was closed using 3-0 Monocryl.  A solution of Marcaine, morphine, and clonidine injected into the knee for  postop pain relief.  Impervious dressings, Ace wrap, and knee immobilizer were placed.  Jesse Howe's assistance was required at all times for retraction, opening, closing and mobilization of tissue.  His assistance was of medical necessity.   PUS D: 04/19/2023 10:53:03 am T: 04/19/2023 11:07:00 am  JOB: [id number]/ [id number]

## 2023-04-19 NOTE — Anesthesia Procedure Notes (Signed)
Procedure Name: LMA Insertion Date/Time: 04/19/2023 7:57 AM  Performed by: Thomasene Ripple, CRNAPre-anesthesia Checklist: Patient identified, Emergency Drugs available, Suction available and Patient being monitored Patient Re-evaluated:Patient Re-evaluated prior to induction Oxygen Delivery Method: Circle System Utilized Preoxygenation: Pre-oxygenation with 100% oxygen Induction Type: IV induction Ventilation: Mask ventilation without difficulty LMA: LMA inserted LMA Size: 4.0 Tube size: 4.0 mm Number of attempts: 1 Airway Equipment and Method: Bite block Placement Confirmation: positive ETCO2 Tube secured with: Tape Dental Injury: Teeth and Oropharynx as per pre-operative assessment

## 2023-04-19 NOTE — Brief Op Note (Signed)
   04/19/2023  10:47 AM  PATIENT:  Jesse Howe  17 y.o. male  PRE-OPERATIVE DIAGNOSIS:  left knee anterior cruciate ligament tear  POST-OPERATIVE DIAGNOSIS:  left knee anterior cruciate ligament tear  PROCEDURE:  Procedure(s): LEFT KNEE ARTHROSCOPY ANTERIOR CRUCIATE LIGAMENT (ACL) RECONSTRUCTION WITH QUADRICEP GRAFT  SURGEON:  Surgeon(s): Cammy Copa, MD  ASSISTANT: magnant pa  ANESTHESIA:   general  EBL: 10 ml    Total I/O In: 700 [I.V.:700] Out: 5 [Blood:5]  BLOOD ADMINISTERED: none  DRAINS: none   LOCAL MEDICATIONS USED:  marcaine morphine sulfate clonidine  SPECIMEN:  No Specimen  COUNTS:  YES  TOURNIQUET:   Total Tourniquet Time Documented: Thigh (Left) - 22 minutes Total: Thigh (Left) - 22 minutes   DICTATION: .Other Dictation: Dictation Number [id number]  PLAN OF CARE: Discharge to home after PACU  PATIENT DISPOSITION:  PACU - hemodynamically stable

## 2023-04-19 NOTE — OR Nursing (Signed)
1003-PATIENT ALLERGIC TO VANCOMYCIN. INFORMED DR. DEAN OF ALLERGY AND STATED HE WANTED TO USE IT TOPICAL. VANCOMYCIN USED IN SURGICAL WOUND PER DR. August Saucer.

## 2023-04-20 ENCOUNTER — Encounter (HOSPITAL_COMMUNITY): Payer: Self-pay | Admitting: Orthopedic Surgery

## 2023-04-23 ENCOUNTER — Telehealth: Payer: Self-pay | Admitting: Orthopedic Surgery

## 2023-04-23 ENCOUNTER — Other Ambulatory Visit: Payer: Self-pay | Admitting: Surgical

## 2023-04-23 MED ORDER — CYCLOBENZAPRINE HCL 7.5 MG PO TABS: 7.5000 mg | ORAL_TABLET | Freq: Three times a day (TID) | ORAL | 0 refills | Status: DC | PRN

## 2023-04-23 MED ORDER — CYCLOBENZAPRINE HCL 5 MG PO TABS: 7.5000 mg | ORAL_TABLET | Freq: Three times a day (TID) | ORAL | 0 refills | Status: DC | PRN

## 2023-04-23 NOTE — Telephone Encounter (Signed)
Tried calling to advise. No answer and no voice mail

## 2023-04-23 NOTE — Telephone Encounter (Signed)
Sent inn flexeril 5mg  for him

## 2023-04-23 NOTE — Telephone Encounter (Signed)
Pt grandmother called in stating he is having bad muscle spasms and the Methocarbamol is not working she would like a call to ask about other medication options

## 2023-04-25 DIAGNOSIS — S83512A Sprain of anterior cruciate ligament of left knee, initial encounter: Secondary | ICD-10-CM

## 2023-04-26 ENCOUNTER — Encounter: Payer: Self-pay | Admitting: Surgical

## 2023-04-26 ENCOUNTER — Ambulatory Visit (INDEPENDENT_AMBULATORY_CARE_PROVIDER_SITE_OTHER): Payer: BC Managed Care – PPO | Admitting: Surgical

## 2023-04-26 ENCOUNTER — Ambulatory Visit (HOSPITAL_COMMUNITY)
Admission: RE | Admit: 2023-04-26 | Discharge: 2023-04-26 | Disposition: A | Discharge status: Home / Self Care | Payer: BC Managed Care – PPO | Source: Ambulatory Visit | Attending: Surgical | Admitting: Surgical

## 2023-04-26 DIAGNOSIS — Z9889 Other specified postprocedural states: Secondary | ICD-10-CM

## 2023-04-26 DIAGNOSIS — M7989 Other specified soft tissue disorders: Secondary | ICD-10-CM | POA: Insufficient documentation

## 2023-04-26 MED ORDER — CELECOXIB 100 MG PO CAPS: 100.0000 mg | ORAL_CAPSULE | Freq: Two times a day (BID) | ORAL | 0 refills | Status: DC

## 2023-04-26 MED ORDER — OXYCODONE HCL 5 MG PO TABS: 5.0000 mg | ORAL_TABLET | Freq: Four times a day (QID) | ORAL | 0 refills | Status: DC | PRN

## 2023-04-26 NOTE — Progress Notes (Signed)
Post-Op Visit Note   Patient: Jesse Howe           Date of Birth: September 14, 2005           MRN: [id number] Visit Date: 04/26/2023 PCP: Ronnald Nian, MD   Assessment & Plan:  Chief Complaint:  Chief Complaint  Patient presents with   Left Knee - Routine Post Op    04/19/2023 left knee ACL reconstruction   Visit Diagnoses:  1. S/P ACL reconstruction     Plan: Patient is a 17 year old male who presents s/p left knee anterior cruciate ligament reconstruction with quadricep autograft on 04/19/2023.  Doing well overall.  Patient states he is having more pain with the surgery than he did compared with his right knee ACL reconstruction.  He has been essentially nonweightbearing over the last week due to pain.  Denies any chest pain or significant shortness of breath.  No fevers or chills.  Does have some calf pain.  Most of his pain is diffusely throughout the knee.  Taking aspirin twice daily.  Taking oxycodone sometimes once a day but sometimes every 4-6 hours around-the-clock just depending on the day.  Using CPM machine up to 70 degrees.  On exam, patient has range of motion from 0 degrees extension to 70 degrees of knee flexion.  Incisions are healing well without evidence of infection or dehiscence.  Sutures removed and replaced Steri-Strips today.  Cannot lift his leg or perform straight leg raise which is expected after this procedure.  Intact ankle dorsiflexion and plantarflexion.  Intact EHL.  2+ DP pulse of the operative extremity.  Positive calf tenderness.  Positive Homans' sign.  No effusion noted.  ACL graft is stable to Lachman exam.  Plan is ultrasound today to rule out DVT with his increased pain compared with last ACL reconstruction and with positive calf tenderness/Homans' sign on exam today.  Will get him set up for physical therapy upstairs.  He should be partial weightbearing with crutches until about 2 weeks postop and then he can be weightbearing as tolerated.   Prescription for Bledsoe brace provided to the patient today to wear at all times when he is ambulating.  Physical therapy upstairs 1-2 times per week to focus on gait training, close chain quadricep strengthening exercises with blood flow restriction therapy, knee range of motion exercises.  Follow-up in 3 to 4 weeks for clinical recheck with Dr. August Saucer.  Follow-Up Instructions: No follow-ups on file.   Orders:  No orders of the defined types were placed in this encounter.  No orders of the defined types were placed in this encounter.   Imaging: No results found.  PMFS History: Patient Active Problem List   Diagnosis Date Noted   Left anterior cruciate ligament tear 04/25/2023   Rupture of anterior cruciate ligament of right knee    Acute lateral meniscus tear of right knee    Complete tear of right ACL, initial encounter 03/18/2021   Acute medial meniscus tear of right knee 03/18/2021   Abnormal finding on EKG 06/19/2019   Need for prophylactic vaccination and inoculation against influenza 08/09/2018   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   Rhinitis, allergic 02/21/2015   Asthma, moderate persistent 02/21/2015   Encounter for follow-up 02/21/2015   Need for HPV vaccination 02/21/2015   Vaccine counseling 02/21/2015   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, requiring support (level 1) 12/07/2013   Developmental disorder 05/11/2013   Past Medical History:  Diagnosis Date  Allergy    RHINITIS   Asperger syndrome    sees psychiatry and counseling   Asthma    Phreesia 08/16/2020   Developmental disorder    Insomnia     Family History  Problem Relation Age of Onset   Arthritis Paternal Grandmother    Diabetes Paternal Grandmother    Depression Paternal Grandmother    Arthritis Paternal Grandfather    Diabetes Paternal Grandfather    Hypertension Paternal Grandfather    Arthritis Mother        rheumatoid   HIV Maternal Grandfather         Died in his 37's    Past Surgical History:  Procedure Laterality Date   ANTERIOR CRUCIATE LIGAMENT REPAIR Right 04/07/2021   Procedure: RIGHT KNEE RECONSTRUCTION ANTERIOR CRUCIATE LIGAMENT (ACL) WITH QUADRICEPS AUTOGRAFT, LATERAL MENISCAL DEBRIDEMENT;  Surgeon: Cammy Copa, MD;  Location: [facility];  Service: Orthopedics;  Laterality: Right;   ANTERIOR CRUCIATE LIGAMENT REPAIR Left 04/19/2023   Procedure: LEFT KNEE ARTHROSCOPY ANTERIOR CRUCIATE LIGAMENT (ACL) RECONSTRUCTION WITH QUADRICEP GRAFT;  Surgeon: Cammy Copa, MD;  Location: [facility];  Service: Orthopedics;  Laterality: Left;   TONSILLECTOMY AND ADENOIDECTOMY Bilateral March 2011   Social History   Occupational History   Not on file  Tobacco Use   Smoking status: Never   Smokeless tobacco: Never  Substance and Sexual Activity   Alcohol use: No   Drug use: No   Sexual activity: Not on file

## 2023-04-26 NOTE — Progress Notes (Signed)
Left lower extremity venous duplex has been completed. Preliminary results can be found in CV Proc through chart review.  Results were given to [facility].  04/26/23 4:07 PM Olen Cordial RVT

## 2023-05-03 ENCOUNTER — Telehealth: Payer: Self-pay | Admitting: Surgical

## 2023-05-03 NOTE — Telephone Encounter (Signed)
04/26/23 ov note faxed to [facility] [phone]

## 2023-05-03 NOTE — Telephone Encounter (Signed)
Use machine 3x/day for 1-2 hours each session.  Needs to be established with PT, referral sent at last visit

## 2023-05-03 NOTE — Telephone Encounter (Signed)
Lvm for grandma to cb to advise

## 2023-05-03 NOTE — Telephone Encounter (Signed)
Patient grandmother called and said that he had surgery two weeks ago. Dean ordered and machine and he told them has to go to 90 but the machine broke down. So he didn't exercise. When the machine gets fixed how long does he have to exercise? CB#[phone]

## 2023-05-04 ENCOUNTER — Encounter (HOSPITAL_BASED_OUTPATIENT_CLINIC_OR_DEPARTMENT_OTHER): Payer: Self-pay

## 2023-05-05 ENCOUNTER — Other Ambulatory Visit: Payer: Self-pay

## 2023-05-05 ENCOUNTER — Encounter: Payer: Self-pay | Admitting: Physical Therapy

## 2023-05-05 ENCOUNTER — Ambulatory Visit (INDEPENDENT_AMBULATORY_CARE_PROVIDER_SITE_OTHER): Payer: BC Managed Care – PPO | Admitting: Physical Therapy

## 2023-05-05 DIAGNOSIS — R2689 Other abnormalities of gait and mobility: Secondary | ICD-10-CM

## 2023-05-05 DIAGNOSIS — M6281 Muscle weakness (generalized): Secondary | ICD-10-CM

## 2023-05-05 DIAGNOSIS — R600 Localized edema: Secondary | ICD-10-CM

## 2023-05-05 DIAGNOSIS — M25562 Pain in left knee: Secondary | ICD-10-CM | POA: Diagnosis not present

## 2023-05-05 DIAGNOSIS — M25662 Stiffness of left knee, not elsewhere classified: Secondary | ICD-10-CM | POA: Diagnosis not present

## 2023-05-05 NOTE — Therapy (Signed)
OUTPATIENT PHYSICAL THERAPY LOWER EXTREMITY EVALUATION   Patient Name: Jesse Howe MRN: [id number] DOB:[birth year], 17 y.o., male Today's Date: 05/05/2023  END OF SESSION:  PT End of Session - 05/05/23 1409     Visit Number 1    Number of Visits 25    Date for PT Re-Evaluation 07/09/23    Authorization Type BCBS & Medicaid    Progress Note Due on Visit 10    PT Start Time 1151    PT Stop Time 1235    PT Time Calculation (min) 44 min    Activity Tolerance Patient tolerated treatment well;Patient limited by fatigue;Patient limited by pain    Behavior During Therapy [facility] for tasks assessed/performed             Past Medical History:  Diagnosis Date   Allergy    RHINITIS   Asperger syndrome    sees psychiatry and counseling   Asthma    [employer]reesia 08/16/2020   Developmental disorder    Insomnia    Past Surgical History:  Procedure Laterality Date   ANTERIOR CRUCIATE LIGAMENT REPAIR Right 04/07/2021   Procedure: RIGHT KNEE RECONSTRUCTION ANTERIOR CRUCIATE LIGAMENT (ACL) WITH QUADRICEPS AUTOGRAFT, LATERAL MENISCAL DEBRIDEMENT;  Surgeon: Cammy Copa, MD;  Location: [facility];  Service: Orthopedics;  Laterality: Right;   ANTERIOR CRUCIATE LIGAMENT REPAIR Left 04/19/2023   Procedure: LEFT KNEE ARTHROSCOPY ANTERIOR CRUCIATE LIGAMENT (ACL) RECONSTRUCTION WITH QUADRICEP GRAFT;  Surgeon: Cammy Copa, MD;  Location: [facility];  Service: Orthopedics;  Laterality: Left;   TONSILLECTOMY AND ADENOIDECTOMY Bilateral March 2011   Patient Active Problem List   Diagnosis Date Noted   Left anterior cruciate ligament tear 04/25/2023   Rupture of anterior cruciate ligament of right knee    Acute lateral meniscus tear of right knee    Complete tear of right ACL, initial encounter 03/18/2021   Acute medial meniscus tear of right knee 03/18/2021   Abnormal finding on EKG 06/19/2019   Need for prophylactic vaccination and inoculation against influenza 08/09/2018    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   Rhinitis, allergic 02/21/2015   Asthma, moderate persistent 02/21/2015   Encounter for follow-up 02/21/2015   Need for HPV vaccination 02/21/2015   Vaccine counseling 02/21/2015   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  This 17yo male underwent Left ACL reconstruction with quad autograft 04/19/2023.  PA note on 04/26/23 stated PWB with crutches for first 2 weeks, then progress to WBAT with Bledsoe brace.  PT for gait training, close chain quadricep strengthening exercises with blood flow restriction therapy, knee range of motion exercises. He has not tried putting weight on left leg. He wanted to wait until PT to try.  He is wearing Bledsoe 24 hrs /day including sleeping.  He is using CPM 4-6 hours /day up to 89*  PERTINENT HISTORY: Left ACL reconstruction with quad autograft 04/19/23, right ACL reconstruction with quad autograft 04/07/21, asthma, Autism spectrum  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, requiring support, developmental disorder  PAIN:  NPRS scale: today 3/10 and in last week 0/10 at rest and highest 7-8/10 Pain location: left knee inside joint Pain description: sharp & achy Aggravating factors: put any weight on it,  bending knee Relieving factors: rest, meds more Aspirin but some  Oxy  PRECAUTIONS: Knee  WEIGHT BEARING RESTRICTIONS: Yes LLE WBAT  FALLS:  Has patient fallen in last 6 months? Only with initial injury  LIVING ENVIRONMENT: Lives with: lives with their family and 2 dogs ~100#   Lives in: [place] with basement (his bedroom) but staying upstairs for now. Stairs: Yes: Internal: 14 steps; on right going up and ramp at main entrance Has following equipment at home: Crutches and Bledsoe brace  OCCUPATION: works at [employer] - lift up to 75#, on feet for 5 hours  PLOF: Independent  PATIENT GOALS:  return to work,  function in community.  Next MD visit:  05/24/2023  OBJECTIVE:  DIAGNOSTIC FINDINGS: MRI 03/29/23: impression - 1. Complete ACL tear. 2. No meniscal tear of the left knee. 3. Osseous contusions of the posterolateral tibial plateau, posteromedial tibial plateau and peripheral corner of the medial femoral condyle. 4. Large joint effusion.  PATIENT SURVEYS:  FOTO intake:  43%  predicted:  69%  COGNITION: Overall cognitive status: WFL    SENSATION: WFL  EDEMA:  Left knee & calf edematous.    PALPATION: Tenderness to touch along entire joint line, patella tendon and quadriceps tendon.  LOWER EXTREMITY ROM:   ROM Right eval Left eval  Hip flexion    Hip extension    Hip abduction    Hip adduction    Hip internal rotation    Hip external rotation    Knee flexion  P: 72*  Knee extension  P: -6*  Ankle dorsiflexion    Ankle plantarflexion    Ankle inversion    Ankle eversion     (Blank rows = not tested)  LOWER EXTREMITY MMT:  MMT Right eval Left eval  Hip flexion    Hip extension    Hip abduction    Hip adduction    Hip internal rotation    Hip external rotation    Knee flexion  3-/5  Knee extension  3-/5  Ankle dorsiflexion    Ankle plantarflexion    Ankle inversion    Ankle eversion     (Blank rows = not tested)  FUNCTIONAL TESTS:  18 inch chair transfer: Modified independent utilizing  bilateral upper extremities with no weight on left lower extremity.   GAIT: Distance walked: 100' Assistive device utilized: Crutches Bledsoe brace locked in extension Level of assistance: SBA / verbal cues Comments: Nonweightbearing left lower extremity   TODAY'S TREATMENT                                                                          DATE: 05/05/2023: Therex: Elevation and HEP instruction/performance c cues for techniques, handout provided.  Trial set performed of each for comprehension and symptom assessment.  See below for exercise list  Gait training: PT demo and verbal cues on sit to/from stand with crutches.  Patient returned demonstration understanding with crutches and  Bledsoe brace in locked extension position. PT demo and verbal cues on partial weightbearing LLE.  Patient ambulated 10 feet and exited PT with crutches PWB LLE with supervision.  PT recommended ambulating with foot in contact with ground partial weightbearing and less he is in a hurry.  Patient verbalized understanding and return demonstration.  PATIENT EDUCATION:  Education details: HEP, POC Person educated: Patient Education method: Programmer, multimedia, Demonstration, Verbal cues, and Handouts Education comprehension: verbalized understanding, returned demonstration, and verbal cues required  HOME EXERCISE PROGRAM: Access Code: [email or web address] URL: [email or web address] Date: 05/05/2023 Prepared by: Vladimir Faster  Exercises - Standing Quad Set  - 3-4 x daily - 7 x weekly - 2-3 sets - 10 reps - 5 seconds hold - Seated Quad Set  - 3-4 x daily - 7 x weekly - 2-3 sets - 10 reps - 5 seconds hold - Ankle Alphabet in Elevation  - 2 x daily - 7 x weekly - 1 sets - >15 min hold  ASSESSMENT: CLINICAL IMPRESSION: Patient is a 17 y.o. who comes to clinic with complaints of left knee pain s/p ACL reconstruction with mobility, strength and movement coordination deficits that impair their ability to perform  usual daily and recreational functional activities without increase difficulty/symptoms at this time.  Patient to benefit from skilled PT services to address impairments and limitations to improve to previous level of function without restriction secondary to condition.   OBJECTIVE IMPAIRMENTS: Abnormal gait, decreased activity tolerance, decreased balance, decreased knowledge of use of DME, decreased mobility, difficulty walking, decreased ROM, decreased strength, increased edema, increased muscle spasms, and pain.   ACTIVITY LIMITATIONS: carrying, lifting, bending, standing, squatting, stairs, transfers, and locomotion level  PARTICIPATION LIMITATIONS: meal prep, cleaning, laundry, community activity, and occupation  PERSONAL FACTORS: 1-2 comorbidities: see PMH  are also affecting patient's functional outcome.   REHAB POTENTIAL: Good  CLINICAL DECISION MAKING: Stable/uncomplicated  EVALUATION COMPLEXITY: Low   GOALS: Goals reviewed with patient? Yes  SHORT TERM GOALS: (target date for Short term goals 06/02/2022)   1.  Patient will demonstrate independent use of home exercise program to maintain progress from in clinic treatments. Baseline: See objective data Goal status: Initial  2. PROM left knee 0* ext to 100* flexion Baseline: See objective data Goal status: Initial  3. Interim FOTO 50% Baseline: See objective data Goal status: Initial  LONG TERM GOALS: (target dates for all long term goals  07/08/2022 )   1. Patient will demonstrate/report pain at worst less than or equal to 2/10 to facilitate minimal limitation in daily activity secondary to pain symptoms. Baseline: See objective data Goal status: Initial   2. Patient will demonstrate independent use of home exercise program to facilitate ability to maintain/progress functional gains from skilled physical therapy services. Baseline: See objective data Goal status: Initial   3. Patient will demonstrate FOTO outcome > or  = 69 % to indicate reduced disability due to condition. Baseline: See objective data Goal status: Initial   4.  Patient will demonstrate left LE MMT 5/5 throughout to faciltiate usual transfers, stairs, squatting at [facility] for daily life.  Baseline: See objective data Goal status: Initial   5.  Patient ambulates >500' without device and negotiates ramps, curbs and stairs single rail alternating pattern modified independent.  Baseline: See objective data Goal status: Initial   6.  Left knee AROM 0* - 105* Baseline: See objective data Goal status: Initial   7.  patient demonstrates ability to safely perform work related tasks.  Baseline: See  objective data Goal status: Initial  PLAN:  PT FREQUENCY:  2-3x/week  PT DURATION: 10 weeks  PLANNED INTERVENTIONS: Therapeutic exercises, Therapeutic activity, Neuro Muscular re-education, Balance training, Gait training, Patient/Family education, Joint mobilization, Stair training, DME instructions, Dry Needling, Electrical stimulation, Traction, Cryotherapy, vasopneumatic deviceMoist heat, Taping, Ultrasound, Ionotophoresis 4mg /ml Dexamethasone, and aquatic therapy, Manual therapy.  All included unless contraindicated  PLAN FOR NEXT SESSION: Review & Update HEP.  Blood flow restriction exercises. Nustep. Gait with crutches PWB LLE.    Vladimir Faster, PT, DPT 05/05/2023, 5:00 PM

## 2023-05-05 NOTE — Telephone Encounter (Signed)
Unable to reach pt. They have PT appt today

## 2023-05-06 ENCOUNTER — Ambulatory Visit: Payer: BC Managed Care – PPO | Admitting: Physical Therapy

## 2023-05-06 ENCOUNTER — Encounter: Payer: Self-pay | Admitting: Physical Therapy

## 2023-05-06 DIAGNOSIS — M25662 Stiffness of left knee, not elsewhere classified: Secondary | ICD-10-CM | POA: Diagnosis not present

## 2023-05-06 DIAGNOSIS — R600 Localized edema: Secondary | ICD-10-CM

## 2023-05-06 DIAGNOSIS — M25562 Pain in left knee: Secondary | ICD-10-CM | POA: Diagnosis not present

## 2023-05-06 DIAGNOSIS — R2689 Other abnormalities of gait and mobility: Secondary | ICD-10-CM | POA: Diagnosis not present

## 2023-05-06 DIAGNOSIS — M6281 Muscle weakness (generalized): Secondary | ICD-10-CM

## 2023-05-06 NOTE — Therapy (Signed)
OUTPATIENT PHYSICAL THERAPY TREATMENT  Patient Name: Jesse Howe MRN: [id number] DOB:09-17-2005, 17 y.o., male Today's Date: 05/06/2023  END OF SESSION:  PT End of Session - 05/06/23 1422     Visit Number 2    Number of Visits 25    Date for PT Re-Evaluation 07/09/23    Authorization Type BCBS & Medicaid    Progress Note Due on Visit 10    PT Start Time 1420    PT Stop Time 1505    PT Time Calculation (min) 45 min    Activity Tolerance Patient tolerated treatment well    Behavior During Therapy WFL for tasks assessed/performed             Past Medical History:  Diagnosis Date   Allergy    RHINITIS   Asperger syndrome    sees psychiatry and counseling   Asthma    [employer]reesia 08/16/2020   Developmental disorder    Insomnia    Past Surgical History:  Procedure Laterality Date   ANTERIOR CRUCIATE LIGAMENT REPAIR Right 04/07/2021   Procedure: RIGHT KNEE RECONSTRUCTION ANTERIOR CRUCIATE LIGAMENT (ACL) WITH QUADRICEPS AUTOGRAFT, LATERAL MENISCAL DEBRIDEMENT;  Surgeon: Cammy Copa, MD;  Location: [facility];  Service: Orthopedics;  Laterality: Right;   ANTERIOR CRUCIATE LIGAMENT REPAIR Left 04/19/2023   Procedure: LEFT KNEE ARTHROSCOPY ANTERIOR CRUCIATE LIGAMENT (ACL) RECONSTRUCTION WITH QUADRICEP GRAFT;  Surgeon: Cammy Copa, MD;  Location: [facility];  Service: Orthopedics;  Laterality: Left;   TONSILLECTOMY AND ADENOIDECTOMY Bilateral March 2011   Patient Active Problem List   Diagnosis Date Noted   Left anterior cruciate ligament tear 04/25/2023   Rupture of anterior cruciate ligament of right knee    Acute lateral meniscus tear of right knee    Complete tear of right ACL, initial encounter 03/18/2021   Acute medial meniscus tear of right knee 03/18/2021   Abnormal finding on EKG 06/19/2019   Need for prophylactic vaccination and inoculation against influenza 08/09/2018   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   Rhinitis, allergic  02/21/2015   Asthma, moderate persistent 02/21/2015   Encounter for follow-up 02/21/2015   Need for HPV vaccination 02/21/2015   Vaccine counseling 02/21/2015   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, requiring support (level 1) 12/07/2013   Developmental disorder 05/11/2013    PCP: Ronnald Nian, MD  REFERRING PROVIDER: Cammy Copa, MD  REFERRING DIAG: [phone] (ICD-10-CM) - S/P ACL reconstruction   THERAPY DIAG:  Muscle weakness (generalized)  Acute pain of left knee  Stiffness of left knee, not elsewhere classified  Other abnormalities of gait and mobility  Localized edema  Rationale for Evaluation and Treatment: Rehabilitation  ONSET DATE: 04/19/2023 Left knee ACL arthroscopy  SUBJECTIVE:   SUBJECTIVE STATEMENT: He relays about 3/10 pain today  PERTINENT HISTORY: Left ACL reconstruction with quad autograft 04/19/23, right ACL reconstruction with quad autograft 04/07/21, asthma, 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, requiring support, developmental disorder  PAIN:  NPRS scale: today 3/10  Pain location: left knee inside joint Pain description: sharp & achy Aggravating factors: put any weight on it,  bending knee Relieving factors: rest, meds more Aspirin but some Oxy  PRECAUTIONS: Knee  WEIGHT BEARING RESTRICTIONS: Yes LLE WBAT  FALLS:  Has patient fallen in last 6 months? Only with initial injury  LIVING ENVIRONMENT: Lives with: lives with their family and 2 dogs ~100#   Lives in: [place] with basement (his bedroom) but staying upstairs for now. Stairs:  Yes: Internal: 14 steps; on right going up and ramp at main entrance Has following equipment at home: Crutches and Bledsoe brace  OCCUPATION: works at [employer] - lift up to 75#, on feet for 5 hours  PLOF: Independent  PATIENT GOALS:  return to work,  function in community.  Next MD visit:  05/24/2023  OBJECTIVE:   DIAGNOSTIC FINDINGS: MRI 03/29/23: impression - 1. Complete ACL tear. 2. No meniscal tear of the left knee. 3. Osseous contusions of the posterolateral tibial plateau, posteromedial tibial plateau and peripheral corner of the medial femoral condyle. 4. Large joint effusion.  PATIENT SURVEYS:  FOTO intake:  43%  predicted:  69%  COGNITION: Overall cognitive status: WFL    SENSATION: WFL  EDEMA:  Left knee & calf edematous.    PALPATION: Tenderness to touch along entire joint line, patella tendon and quadriceps tendon.  LOWER EXTREMITY ROM:   ROM Right eval Left eval Left 05/06/23  Hip flexion     Hip extension     Hip abduction     Hip adduction     Hip internal rotation     Hip external rotation     Knee flexion  P: 72* P: 85  Knee extension  P: -6*   Ankle dorsiflexion     Ankle plantarflexion     Ankle inversion     Ankle eversion      (Blank rows = not tested)  LOWER EXTREMITY MMT:  MMT Right eval Left eval  Hip flexion    Hip extension    Hip abduction    Hip adduction    Hip internal rotation    Hip external rotation    Knee flexion  3-/5  Knee extension  3-/5  Ankle dorsiflexion    Ankle plantarflexion    Ankle inversion    Ankle eversion     (Blank rows = not tested)  FUNCTIONAL TESTS:  18 inch chair transfer: Modified independent utilizing bilateral upper extremities with no weight on left lower extremity.   GAIT: Distance walked: 100' Assistive device utilized: Crutches Bledsoe brace locked in extension Level of assistance: SBA / verbal cues Comments: Nonweightbearing left lower extremity   TODAY'S TREATMENT                                                                          DATE: 05/06/23 Manual therapy: PROM knee flexion to tolerance Therex: Gait with crutches 30 feet X2 PWB Longsitting heel slides with strap 5 sec hold X 10 Long sitting SLR with strap and hold hamstring stretch 5 sec X10 BFR training cuff size 3 with LOP  180 mmHG in sitting, exercised at 80% for following Quad sets with BFR 56,43,32,95 with 30 sec rest intervals Heelslides AROM with BFR 18,84,16,60   05/05/2023: Therex: Elevation and HEP instruction/performance c cues for techniques, handout provided.  Trial set performed of each for comprehension and symptom assessment.  See below for exercise list  Gait training: PT demo and verbal cues on sit to/from stand with crutches.  Patient returned demonstration understanding with crutches and Bledsoe brace in locked extension position. PT demo and verbal cues on partial weightbearing LLE.  Patient ambulated 10 feet and exited PT  with crutches PWB LLE with supervision.  PT recommended ambulating with foot in contact with ground partial weightbearing and less he is in a hurry.  Patient verbalized understanding and return demonstration.  PATIENT EDUCATION:  Education details: HEP, POC Person educated: Patient Education method: Programmer, multimedia, Demonstration, Verbal cues, and Handouts Education comprehension: verbalized understanding, returned demonstration, and verbal cues required  HOME EXERCISE PROGRAM: Access Code: [email or web address] URL: [email or web address] Date: 05/05/2023 Prepared by: Vladimir Faster  Exercises - Standing Quad Set  - 3-4 x daily - 7 x weekly - 2-3 sets - 10 reps - 5 seconds hold - Seated Quad Set  - 3-4 x daily - 7 x weekly - 2-3 sets - 10 reps - 5 seconds hold - Ankle Alphabet in Elevation  - 2 x daily - 7 x weekly - 1 sets - >15 min hold -05/06/23 added in heel slides for knee flexion ROM 5 sec X 10, 3 times per day  ASSESSMENT: CLINICAL IMPRESSION: Worked on knee ROM today as well as began BFR training for quad activation and strengthening with good overall tolerance to this. We will monitor for any soreness and adjust as needed. He is doing better with ambulating PWB with crutches.  OBJECTIVE IMPAIRMENTS: Abnormal gait, decreased activity tolerance,  decreased balance, decreased knowledge of use of DME, decreased mobility, difficulty walking, decreased ROM, decreased strength, increased edema, increased muscle spasms, and pain.   ACTIVITY LIMITATIONS: carrying, lifting, bending, standing, squatting, stairs, transfers, and locomotion level  PARTICIPATION LIMITATIONS: meal prep, cleaning, laundry, community activity, and occupation  PERSONAL FACTORS: 1-2 comorbidities: see PMH  are also affecting patient's functional outcome.   REHAB POTENTIAL: Good  CLINICAL DECISION MAKING: Stable/uncomplicated  EVALUATION COMPLEXITY: Low   GOALS: Goals reviewed with patient? Yes  SHORT TERM GOALS: (target date for Short term goals 06/02/2022)   1.  Patient will demonstrate independent use of home exercise program to maintain progress from in clinic treatments. Baseline: See objective data Goal status: Initial  2. PROM left knee 0* ext to 100* flexion Baseline: See objective data Goal status: Initial  3. Interim FOTO 50% Baseline: See objective data Goal status: Initial  LONG TERM GOALS: (target dates for all long term goals  07/08/2022 )   1. Patient will demonstrate/report pain at worst less than or equal to 2/10 to facilitate minimal limitation in daily activity secondary to pain symptoms. Baseline: See objective data Goal status: Initial   2. Patient will demonstrate independent use of home exercise program to facilitate ability to maintain/progress functional gains from skilled physical therapy services. Baseline: See objective data Goal status: Initial   3. Patient will demonstrate FOTO outcome > or = 69 % to indicate reduced disability due to condition. Baseline: See objective data Goal status: Initial   4.  Patient will demonstrate left LE MMT 5/5 throughout to faciltiate usual transfers, stairs, squatting at [facility] for daily life.  Baseline: See objective data Goal status: Initial   5.  Patient ambulates >500' without device  and negotiates ramps, curbs and stairs single rail alternating pattern modified independent.  Baseline: See objective data Goal status: Initial   6.  Left knee AROM 0* - 105* Baseline: See objective data Goal status: Initial   7.  patient demonstrates ability to safely perform work related tasks.  Baseline: See objective data Goal status: Initial  PLAN:  PT FREQUENCY:  2-3x/week  PT DURATION: 10 weeks  PLANNED INTERVENTIONS: Therapeutic exercises, Therapeutic activity, Neuro Muscular re-education, Balance training, Gait training, Patient/Family education, Joint  mobilization, Stair training, DME instructions, Dry Needling, Electrical stimulation, Traction, Cryotherapy, vasopneumatic deviceMoist heat, Taping, Ultrasound, Ionotophoresis 4mg /ml Dexamethasone, and aquatic therapy, Manual therapy.  All included unless contraindicated  PLAN FOR NEXT SESSION:  Blood flow restriction exercises. Nustep. Gait with crutches PWB in locked knee brace.   April Manson, PT, DPT 05/06/2023, 2:23 PM

## 2023-05-10 ENCOUNTER — Telehealth: Payer: Self-pay | Admitting: Family Medicine

## 2023-05-10 NOTE — Telephone Encounter (Signed)
Please call   Pt grandmother called , he had ACL surgery few weeks ago and is having trouble sleeping He is no longer on pain meds or muscle relaxer  Sallye Ober has tried melatonin 10 mg but it has not helped, she wants to know what OTC she can have him try

## 2023-05-10 NOTE — Telephone Encounter (Signed)
Pt was notified.  

## 2023-05-11 ENCOUNTER — Encounter: Payer: Self-pay | Admitting: Physical Therapy

## 2023-05-11 ENCOUNTER — Ambulatory Visit (INDEPENDENT_AMBULATORY_CARE_PROVIDER_SITE_OTHER): Payer: BC Managed Care – PPO | Admitting: Physical Therapy

## 2023-05-11 DIAGNOSIS — M25662 Stiffness of left knee, not elsewhere classified: Secondary | ICD-10-CM

## 2023-05-11 DIAGNOSIS — M6281 Muscle weakness (generalized): Secondary | ICD-10-CM | POA: Diagnosis not present

## 2023-05-11 DIAGNOSIS — M25562 Pain in left knee: Secondary | ICD-10-CM

## 2023-05-11 DIAGNOSIS — R2689 Other abnormalities of gait and mobility: Secondary | ICD-10-CM

## 2023-05-11 DIAGNOSIS — R600 Localized edema: Secondary | ICD-10-CM

## 2023-05-11 NOTE — Therapy (Signed)
OUTPATIENT PHYSICAL THERAPY TREATMENT  Patient Name: Jesse Howe MRN: [id number] DOB:Apr 05, 2006, 17 y.o., male Today's Date: 05/11/2023  END OF SESSION:  PT End of Session - 05/11/23 0931     Visit Number 3    Number of Visits 25    Date for PT Re-Evaluation 07/09/23    Authorization Type BCBS & Medicaid    Progress Note Due on Visit 10    PT Start Time 0926    PT Stop Time 1015    PT Time Calculation (min) 49 min    Activity Tolerance Patient tolerated treatment well    Behavior During Therapy [facility] for tasks assessed/performed              Past Medical History:  Diagnosis Date   Allergy    RHINITIS   Asperger syndrome    sees psychiatry and counseling   Asthma    [employer] 08/16/2020   Developmental disorder    Insomnia    Past Surgical History:  Procedure Laterality Date   ANTERIOR CRUCIATE LIGAMENT REPAIR Right 04/07/2021   Procedure: RIGHT KNEE RECONSTRUCTION ANTERIOR CRUCIATE LIGAMENT (ACL) WITH QUADRICEPS AUTOGRAFT, LATERAL MENISCAL DEBRIDEMENT;  Surgeon: Cammy Copa, MD;  Location: [facility];  Service: Orthopedics;  Laterality: Right;   ANTERIOR CRUCIATE LIGAMENT REPAIR Left 04/19/2023   Procedure: LEFT KNEE ARTHROSCOPY ANTERIOR CRUCIATE LIGAMENT (ACL) RECONSTRUCTION WITH QUADRICEP GRAFT;  Surgeon: Cammy Copa, MD;  Location: [facility];  Service: Orthopedics;  Laterality: Left;   TONSILLECTOMY AND ADENOIDECTOMY Bilateral March 2011   Patient Active Problem List   Diagnosis Date Noted   Left anterior cruciate ligament tear 04/25/2023   Rupture of anterior cruciate ligament of right knee    Acute lateral meniscus tear of right knee    Complete tear of right ACL, initial encounter 03/18/2021   Acute medial meniscus tear of right knee 03/18/2021   Abnormal finding on EKG 06/19/2019   Need for prophylactic vaccination and inoculation against influenza 08/09/2018   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   Rhinitis, allergic  02/21/2015   Asthma, moderate persistent 02/21/2015   Encounter for follow-up 02/21/2015   Need for HPV vaccination 02/21/2015   Vaccine counseling 02/21/2015   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 He has been keeping LLE on floor with gait with crutches.  One near fall but able to catch himself.    PERTINENT HISTORY: Left ACL reconstruction with quad autograft 04/19/23, right ACL reconstruction with quad autograft 04/07/21, asthma, 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, requiring support, developmental disorder  PAIN:  NPRS scale: today   0/10 and since last PT 2/10 Pain location: left knee inside joint Pain description: sharp & achy Aggravating factors: put any weight on it,  bending knee Relieving factors: rest, meds more Aspirin but some Oxy  PRECAUTIONS: Knee  WEIGHT BEARING RESTRICTIONS: Yes LLE WBAT  FALLS:  Has patient fallen in last 6 months?  Only with initial injury  LIVING ENVIRONMENT: Lives with: lives with their family and 2 dogs ~100#   Lives in: [place] with basement (his bedroom) but staying upstairs for now. Stairs: Yes: Internal: 14 steps; on right going up and ramp at main entrance Has following equipment at home: Crutches and Bledsoe brace  OCCUPATION: works at [employer] - lift  up to 75#, on feet for 5 hours  PLOF: Independent  PATIENT GOALS:  return to work,  function in community.  Next MD visit:  05/24/2023  OBJECTIVE:  DIAGNOSTIC FINDINGS: MRI 03/29/23: impression - 1. Complete ACL tear. 2. No meniscal tear of the left knee. 3. Osseous contusions of the posterolateral tibial plateau, posteromedial tibial plateau and peripheral corner of the medial femoral condyle. 4. Large joint effusion.  PATIENT SURVEYS:  FOTO intake:  43%  predicted:  69%  COGNITION: Overall cognitive status: WFL    SENSATION: WFL  EDEMA:  Left knee & calf edematous.    PALPATION: Tenderness to touch along entire joint line, patella tendon and quadriceps tendon.  LOWER EXTREMITY ROM:   ROM Right eval Left eval Left 05/06/23  Hip flexion     Hip extension     Hip abduction     Hip adduction     Hip internal rotation     Hip external rotation     Knee flexion  P: 72* P: 85  Knee extension  P: -6*   Ankle dorsiflexion     Ankle plantarflexion     Ankle inversion     Ankle eversion      (Blank rows = not tested)  LOWER EXTREMITY MMT:  MMT Right eval Left eval  Hip flexion    Hip extension    Hip abduction    Hip adduction    Hip internal rotation    Hip external rotation    Knee flexion  3-/5  Knee extension  3-/5  Ankle dorsiflexion    Ankle plantarflexion    Ankle inversion    Ankle eversion     (Blank rows = not tested)  FUNCTIONAL TESTS:  18 inch chair transfer: Modified independent utilizing bilateral upper extremities with no weight on left lower extremity.   GAIT: Distance walked: 100' Assistive device utilized: Crutches Bledsoe brace locked in extension Level of assistance: SBA / verbal cues Comments: Nonweightbearing left lower extremity   TODAY'S TREATMENT                                                                          DATE: 05/11/23 Therex: SciFit Bike seat 12 BLEs & BUEs rocking for knee flexion stretch for 6 min then  full revolution backwards for 2 min.  Bledsoe brace in place but unlocked. Longsitting heel slides 5 sec hold  ext and flexion with RLE end range hamstring set 5 sec X 10 BFR training cuff size 3 with LOP 180 mmHG in sitting, exercised at 80% for following Quad sets with BFR 30,15,15,15 with 30 sec rest intervals BFR training cuff size 3 with LOP 222 mmHG in standing, exercised at 80% for following Standing Terminal Knee Ext with green theraband with BFR 28,41,32,44  Gait Training: Gait with crutches enter/exit  PWB with Bledsoe brace locked. PT demo & verbal cues on technique amb with Bledsoe unlocked with stance knee ext and swing knee flexion. Pt amb 20' with crutches with CGA for safety but no knee instability noted.     TREATMENT                                                                          DATE: 05/06/23 Manual therapy: PROM knee flexion to tolerance Therex: Gait with crutches 30 feet X2 PWB Longsitting heel slides with strap 5 sec hold X 10 Long sitting SLR with strap and hold hamstring stretch 5 sec X10 BFR training cuff size 3 with LOP 180 mmHG in sitting, exercised at 80% for following Quad sets with BFR 30,15,15,15 with 30 sec rest intervals Heelslides AROM with BFR 16,10,96,04   05/05/2023: Therex: Elevation and HEP instruction/performance c cues for techniques, handout provided.  Trial set performed of each for comprehension and symptom assessment.  See below for exercise list  Gait training: PT demo and verbal cues on sit to/from stand with crutches.  Patient returned demonstration understanding with crutches and Bledsoe brace in locked extension position. PT demo and verbal cues on partial weightbearing LLE.  Patient ambulated 10 feet and exited PT with crutches PWB LLE with supervision.  PT recommended ambulating with foot in contact with ground partial weightbearing and less he is in a hurry.  Patient verbalized understanding and return  demonstration.  PATIENT EDUCATION:  Education details: HEP, POC Person educated: Patient Education method: Programmer, multimedia, Demonstration, Verbal cues, and Handouts Education comprehension: verbalized understanding, returned demonstration, and verbal cues required  HOME EXERCISE PROGRAM: Access Code: [email or web address] URL: [email or web address] Date: 05/05/2023 Prepared by: Vladimir Faster  Exercises - Standing Quad Set  - 3-4 x daily - 7 x weekly - 2-3 sets - 10 reps - 5 seconds hold - Seated Quad Set  - 3-4 x daily - 7 x weekly - 2-3 sets - 10 reps - 5 seconds hold - Ankle Alphabet in Elevation  - 2 x daily - 7 x weekly - 1 sets - >15 min hold -05/06/23 added in heel slides for knee flexion ROM 5 sec X 10, 3 times per day  ASSESSMENT: CLINICAL IMPRESSION: PT progressed gait to amb short distance with Bledsoe brace unlocked while in PT.  He was able to control knee stability in stance but had limited knee flexion in swing.  PT progressed BFR to standing exercise.   He is doing better with ambulating PWB with crutches.  OBJECTIVE IMPAIRMENTS: Abnormal gait, decreased activity tolerance, decreased balance, decreased knowledge of use of DME, decreased mobility, difficulty walking, decreased ROM, decreased strength, increased edema, increased muscle spasms, and pain.   ACTIVITY LIMITATIONS: carrying, lifting, bending, standing, squatting, stairs, transfers, and locomotion level  PARTICIPATION LIMITATIONS: meal prep, cleaning, laundry, community activity, and occupation  PERSONAL FACTORS: 1-2 comorbidities: see PMH  are also affecting patient's functional outcome.   REHAB POTENTIAL: Good  CLINICAL DECISION MAKING: Stable/uncomplicated  EVALUATION COMPLEXITY: Low   GOALS: Goals reviewed with patient? Yes  SHORT TERM GOALS: (target date for Short term goals 06/02/2022)   1.  Patient will demonstrate independent use of home exercise program to maintain progress from in  clinic  treatments. Baseline: See objective data Goal status: Ongoing 05/11/2023  2. PROM left knee 0* ext to 100* flexion Baseline: See objective data Goal status: Ongoing 05/11/2023  3. Interim FOTO 50% Baseline: See objective data Goal status: Ongoing 05/11/2023  LONG TERM GOALS: (target dates for all long term goals  07/08/2022 )   1. Patient will demonstrate/report pain at worst less than or equal to 2/10 to facilitate minimal limitation in daily activity secondary to pain symptoms. Baseline: See objective data Goal status: Ongoing 05/11/2023   2. Patient will demonstrate independent use of home exercise program to facilitate ability to maintain/progress functional gains from skilled physical therapy services. Baseline: See objective data Goal status:Ongoing 05/11/2023   3. Patient will demonstrate FOTO outcome > or = 69 % to indicate reduced disability due to condition. Baseline: See objective data Goal status: Ongoing 05/11/2023   4.  Patient will demonstrate left LE MMT 5/5 throughout to faciltiate usual transfers, stairs, squatting at [facility] for daily life.  Baseline: See objective data Goal status: Ongoing 05/11/2023   5.  Patient ambulates >500' without device and negotiates ramps, curbs and stairs single rail alternating pattern modified independent.  Baseline: See objective data Goal status: Ongoing 05/11/2023   6.  Left knee AROM 0* - 105* Baseline: See objective data Goal status: Ongoing 05/11/2023   7.  patient demonstrates ability to safely perform work related tasks.  Baseline: See objective data Goal status:   Ongoing 05/11/2023  PLAN:  PT FREQUENCY:  2-3x/week  PT DURATION: 10 weeks  PLANNED INTERVENTIONS: Therapeutic exercises, Therapeutic activity, Neuro Muscular re-education, Balance training, Gait training, Patient/Family education, Joint mobilization, Stair training, DME instructions, Dry Needling, Electrical stimulation, Traction, Cryotherapy,  vasopneumatic deviceMoist heat, Taping, Ultrasound, Ionotophoresis 4mg /ml Dexamethasone, and aquatic therapy, Manual therapy.  All included unless contraindicated  PLAN FOR NEXT SESSION:  continue  Blood flow restriction exercises. SciFit bike.   Gait training in clinic with crutches PWB with unlocked knee brace.   Vladimir Faster, PT, DPT 05/11/2023, 1:25 PM

## 2023-05-14 ENCOUNTER — Ambulatory Visit (INDEPENDENT_AMBULATORY_CARE_PROVIDER_SITE_OTHER): Payer: BC Managed Care – PPO | Admitting: Rehabilitative and Restorative Service Providers"

## 2023-05-14 ENCOUNTER — Encounter: Payer: Self-pay | Admitting: Rehabilitative and Restorative Service Providers"

## 2023-05-14 DIAGNOSIS — M25562 Pain in left knee: Secondary | ICD-10-CM

## 2023-05-14 DIAGNOSIS — R600 Localized edema: Secondary | ICD-10-CM

## 2023-05-14 DIAGNOSIS — M6281 Muscle weakness (generalized): Secondary | ICD-10-CM | POA: Diagnosis not present

## 2023-05-14 DIAGNOSIS — R2689 Other abnormalities of gait and mobility: Secondary | ICD-10-CM | POA: Diagnosis not present

## 2023-05-14 DIAGNOSIS — M25662 Stiffness of left knee, not elsewhere classified: Secondary | ICD-10-CM

## 2023-05-14 NOTE — Therapy (Signed)
OUTPATIENT PHYSICAL THERAPY TREATMENT  Patient Name: Jesse Howe MRN: [id number] DOB:[birth year], 17 y.o., male Today's Date: 05/14/2023  END OF SESSION:  PT End of Session - 05/14/23 1155     Visit Number 4    Number of Visits 25    Date for PT Re-Evaluation 07/09/23    Authorization Type BCBS & Medicaid    Progress Note Due on Visit 10    PT Start Time 1146    PT Stop Time 1232    PT Time Calculation (min) 46 min    Activity Tolerance Patient tolerated treatment well;No increased pain;Patient limited by pain    Behavior During Therapy [facility] for tasks assessed/performed              Past Medical History:  Diagnosis Date   Allergy    RHINITIS   Asperger syndrome    sees psychiatry and counseling   Asthma    [employer]reesia 08/16/2020   Developmental disorder    Insomnia    Past Surgical History:  Procedure Laterality Date   ANTERIOR CRUCIATE LIGAMENT REPAIR Right 04/07/2021   Procedure: RIGHT KNEE RECONSTRUCTION ANTERIOR CRUCIATE LIGAMENT (ACL) WITH QUADRICEPS AUTOGRAFT, LATERAL MENISCAL DEBRIDEMENT;  Surgeon: Cammy Copa, MD;  Location: [facility];  Service: Orthopedics;  Laterality: Right;   ANTERIOR CRUCIATE LIGAMENT REPAIR Left 04/19/2023   Procedure: LEFT KNEE ARTHROSCOPY ANTERIOR CRUCIATE LIGAMENT (ACL) RECONSTRUCTION WITH QUADRICEP GRAFT;  Surgeon: Cammy Copa, MD;  Location: [facility];  Service: Orthopedics;  Laterality: Left;   TONSILLECTOMY AND ADENOIDECTOMY Bilateral March 2011   Patient Active Problem List   Diagnosis Date Noted   Left anterior cruciate ligament tear 04/25/2023   Rupture of anterior cruciate ligament of right knee    Acute lateral meniscus tear of right knee    Complete tear of right ACL, initial encounter 03/18/2021   Acute medial meniscus tear of right knee 03/18/2021   Abnormal finding on EKG 06/19/2019   Need for prophylactic vaccination and inoculation against influenza 08/09/2018   Depression in pediatric  patient 08/06/2018   Rhinitis, allergic 02/21/2015   Asthma, moderate persistent 02/21/2015   Encounter for follow-up 02/21/2015   Need for HPV vaccination 02/21/2015   Vaccine counseling 02/21/2015   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  He is familiar with the process secondary to going through ACL rehabilitation on the right side in 2022.  PERTINENT HISTORY: Left ACL reconstruction with quad autograft 04/19/23, right ACL reconstruction with quad autograft 04/07/21, asthma, 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, requiring support, developmental disorder  PAIN:  NPRS scale: 0-5/10 this week Pain location: left knee inside joint Pain description: sharp & achy Aggravating factors: put any weight on it, bending knee Relieving factors: rest, meds more Aspirin but some Oxy  PRECAUTIONS: Knee  WEIGHT BEARING RESTRICTIONS: Yes LLE WBAT  FALLS:  Has patient fallen in last 6 months? Only with initial injury  LIVING ENVIRONMENT: Lives with:  lives with their family and 2 dogs ~100#   Lives in: [place] with basement (his bedroom) but staying upstairs for now. Stairs: Yes: Internal: 14 steps; on right going up and ramp at main entrance Has following equipment at home: Crutches and Bledsoe brace  OCCUPATION: works at [employer] - lift up to 75#, on feet for 5 hours  PLOF: Independent  PATIENT GOALS:  return to work,  function in community.  Next MD visit:  05/24/2023  OBJECTIVE:  DIAGNOSTIC FINDINGS: MRI 03/29/23: impression - 1. Complete ACL tear. 2. No meniscal tear of the left knee. 3. Osseous contusions of the posterolateral tibial plateau, posteromedial tibial plateau and peripheral corner of the medial femoral condyle. 4. Large joint effusion.  PATIENT SURVEYS:  FOTO intake:  43%  predicted:  69%  COGNITION: Overall cognitive status: WFL    SENSATION: WFL  EDEMA:  Left knee & calf edematous.    PALPATION: Tenderness to touch along entire joint line, patella tendon and quadriceps tendon.  LOWER EXTREMITY ROM:   ROM Right eval Left eval Left 05/06/23 Left/Right Active 05/14/2023  Hip flexion      Hip extension      Hip abduction      Hip adduction      Hip internal rotation      Hip external rotation      Knee flexion  P: 72* P: 85 78/142  Knee extension  P: -6*  0/0  Ankle dorsiflexion      Ankle plantarflexion      Ankle inversion      Ankle eversion       (Blank rows = not tested)  LOWER EXTREMITY MMT:  MMT Right eval Left eval  Hip flexion    Hip extension    Hip abduction    Hip adduction    Hip internal rotation    Hip external rotation    Knee flexion  3-/5  Knee extension  3-/5  Ankle dorsiflexion    Ankle plantarflexion    Ankle inversion    Ankle eversion     (Blank rows = not tested)  FUNCTIONAL TESTS:  18 inch chair transfer: Modified independent utilizing bilateral upper extremities with no weight on left lower extremity.   GAIT: Distance walked: 100' Assistive device utilized: Crutches Bledsoe brace locked in extension Level of assistance: SBA / verbal cues Comments: Nonweightbearing left lower extremity   TODAY'S TREATMENT                                                                          DATE: 05/13/2023 BFR with quadriceps  sets supine  BFR training cuff size 3 with LOP 180 mmHG in sitting, exercised at 80% for following Quad sets with BFR 30,15,15,15 with 30 sec rest intervals Seated hamstrings slide 10 x 5 seconds Seated hamstrings isometrics 10 x 5 seconds  Vaso left knee 5 minutes 34* Medium Pressure   05/11/23 Therex: SciFit Bike seat 12 BLEs & BUEs rocking for knee flexion stretch for 6 min then full revolution backwards for 2 min.  Bledsoe brace in place but unlocked. Longsitting heel slides 5 sec hold  ext and flexion with RLE end range hamstring set  5 sec X 10 BFR training cuff size 3 with LOP 180 mmHG in sitting, exercised at 80% for following Quad sets with BFR 30,15,15,15 with 30 sec rest intervals BFR training cuff size 3 with LOP 222 mmHG in standing, exercised at 80% for following Standing Terminal Knee Ext with green theraband with BFR 16,10,96,04  Gait Training: Gait with crutches enter/exit PWB with Bledsoe brace locked. PT demo & verbal cues on technique amb with Bledsoe unlocked with stance knee ext and swing knee flexion. Pt amb 20' with crutches with CGA for safety but no knee instability noted.    05/06/23 Manual therapy: PROM knee flexion to tolerance Therex: Gait with crutches 30 feet X2 PWB Longsitting heel slides with strap 5 sec hold X 10 Long sitting SLR with strap and hold hamstring stretch 5 sec X10 BFR training cuff size 3 with LOP 180 mmHG in sitting, exercised at 80% for following Quad sets with BFR 30,15,15,15 with 30 sec rest intervals Heelslides AROM with BFR 54,09,81,19   05/05/2023: Therex: Elevation and HEP instruction/performance c cues for techniques, handout provided.  Trial set performed of each for comprehension and symptom assessment.  See below for exercise list  Gait training: PT demo and verbal cues on sit to/from stand with crutches.  Patient returned demonstration understanding with crutches and Bledsoe brace in  locked extension position. PT demo and verbal cues on partial weightbearing LLE.  Patient ambulated 10 feet and exited PT with crutches PWB LLE with supervision.  PT recommended ambulating with foot in contact with ground partial weightbearing and less he is in a hurry.  Patient verbalized understanding and return demonstration.  PATIENT EDUCATION:  Education details: HEP, POC Person educated: Patient Education method: Programmer, multimedia, Demonstration, Verbal cues, and Handouts Education comprehension: verbalized understanding, returned demonstration, and verbal cues required  HOME EXERCISE PROGRAM: Access Code: [email or web address] URL: [email or web address] Date: 05/14/2023 Prepared by: Jesse Howe  Exercises - Standing Quad Set  - 3-4 x daily - 7 x weekly - 2-3 sets - 10 reps - 5 seconds hold - Seated Quad Set  - 3-4 x daily - 7 x weekly - 2-3 sets - 10 reps - 5 seconds hold - Ankle Alphabet in Elevation  - 2 x daily - 7 x weekly - 1 sets - >15 min hold - Seated Hamstring Set  - 3 x daily - 7 x weekly - 10 reps - 5 seconds hold - Seated Heel Slide  - 3 x daily - 7 x weekly - 10 reps - 5 seconds hold  ASSESSMENT: CLINICAL IMPRESSION: Jesse Howe did a good job with recall and performance of his current HEP.  We added a knee flexion AROM and hamstrings strengthening exercise to his current program.  Jesse Howe was also encouraged to get in 500 quadriceps sets a day to minimize quadriceps atrophy and encourage quadriceps activtion.  Continue current POC.  OBJECTIVE IMPAIRMENTS: Abnormal gait, decreased activity tolerance, decreased balance, decreased knowledge of use of DME, decreased mobility, difficulty walking, decreased ROM, decreased strength, increased edema, increased muscle spasms, and pain.   ACTIVITY LIMITATIONS: carrying, lifting, bending, standing, squatting, stairs, transfers, and locomotion level  PARTICIPATION LIMITATIONS: meal prep, cleaning, laundry, community activity, and  occupation  PERSONAL FACTORS: 1-2 comorbidities: see PMH  are also affecting patient's functional outcome.   REHAB POTENTIAL: Good  CLINICAL DECISION MAKING: Stable/uncomplicated  EVALUATION COMPLEXITY: Low   GOALS: Goals reviewed with patient? Yes  SHORT TERM GOALS: (target date for Short term goals 06/02/2022)  1.  Patient will demonstrate independent use of home exercise program to maintain progress from in clinic treatments. Baseline: See objective data Goal status: Met 05/14/2023  2. PROM left knee 0* ext to 100* flexion Baseline: See objective data Goal status: Partially Met 05/14/2023  3. Interim FOTO 50% Baseline: See objective data Goal status: Ongoing 05/11/2023  LONG TERM GOALS: (target dates for all long term goals  07/08/2022 )   1. Patient will demonstrate/report pain at worst less than or equal to 2/10 to facilitate minimal limitation in daily activity secondary to pain symptoms. Baseline: See objective data Goal status: Ongoing 05/11/2023   2. Patient will demonstrate independent use of home exercise program to facilitate ability to maintain/progress functional gains from skilled physical therapy services. Baseline: See objective data Goal status:Ongoing 05/11/2023   3. Patient will demonstrate FOTO outcome > or = 69 % to indicate reduced disability due to condition. Baseline: See objective data Goal status: Ongoing 05/11/2023   4.  Patient will demonstrate left LE MMT 5/5 throughout to faciltiate usual transfers, stairs, squatting at [facility] for daily life.  Baseline: See objective data Goal status: Ongoing 05/11/2023   5.  Patient ambulates >500' without device and negotiates ramps, curbs and stairs single rail alternating pattern modified independent.  Baseline: See objective data Goal status: Ongoing 05/11/2023   6.  Left knee AROM 0* - 105* Baseline: See objective data Goal status: Ongoing 05/11/2023   7.  patient demonstrates ability to safely  perform work related tasks.  Baseline: See objective data Goal status:   Ongoing 05/11/2023  PLAN:  PT FREQUENCY:  2-3x/week  PT DURATION: 10 weeks  PLANNED INTERVENTIONS: Therapeutic exercises, Therapeutic activity, Neuro Muscular re-education, Balance training, Gait training, Patient/Family education, Joint mobilization, Stair training, DME instructions, Dry Needling, Electrical stimulation, Traction, Cryotherapy, vasopneumatic deviceMoist heat, Taping, Ultrasound, Ionotophoresis 4mg /ml Dexamethasone, and aquatic therapy, Manual therapy.  All included unless contraindicated  PLAN FOR NEXT SESSION:  Blood flow restriction exercises.  SciFit bike.   Gait training in clinic with crutches PWB with unlocked knee brace.  Quadriceps and hamstrings strengthening, knee flexion AROM.   Cherlyn Cushing, PT, MPT 05/14/2023, 12:39 PM

## 2023-05-18 ENCOUNTER — Ambulatory Visit (INDEPENDENT_AMBULATORY_CARE_PROVIDER_SITE_OTHER): Payer: BC Managed Care – PPO | Admitting: Physical Therapy

## 2023-05-18 ENCOUNTER — Encounter: Payer: Self-pay | Admitting: Physical Therapy

## 2023-05-18 DIAGNOSIS — M6281 Muscle weakness (generalized): Secondary | ICD-10-CM | POA: Diagnosis not present

## 2023-05-18 DIAGNOSIS — M25662 Stiffness of left knee, not elsewhere classified: Secondary | ICD-10-CM | POA: Diagnosis not present

## 2023-05-18 DIAGNOSIS — M25562 Pain in left knee: Secondary | ICD-10-CM | POA: Diagnosis not present

## 2023-05-18 DIAGNOSIS — R2689 Other abnormalities of gait and mobility: Secondary | ICD-10-CM | POA: Diagnosis not present

## 2023-05-18 DIAGNOSIS — R600 Localized edema: Secondary | ICD-10-CM

## 2023-05-18 NOTE — Therapy (Signed)
OUTPATIENT PHYSICAL THERAPY TREATMENT  Patient Name: Jesse Howe MRN: [id number] DOB:February 11, 2006, 17 y.o., male Today's Date: 05/18/2023  END OF SESSION:  PT End of Session - 05/18/23 1059     Visit Number 5    Number of Visits 25    Date for PT Re-Evaluation 07/09/23    Authorization Type BCBS & Medicaid    Progress Note Due on Visit 10    PT Start Time 1050    PT Stop Time 1150    PT Time Calculation (min) 60 min    Activity Tolerance Patient tolerated treatment well;No increased pain    Behavior During Therapy [facility] for tasks assessed/performed              Past Medical History:  Diagnosis Date   Allergy    RHINITIS   Asperger syndrome    sees psychiatry and counseling   Asthma    [employer]reesia 08/16/2020   Developmental disorder    Insomnia    Past Surgical History:  Procedure Laterality Date   ANTERIOR CRUCIATE LIGAMENT REPAIR Right 04/07/2021   Procedure: RIGHT KNEE RECONSTRUCTION ANTERIOR CRUCIATE LIGAMENT (ACL) WITH QUADRICEPS AUTOGRAFT, LATERAL MENISCAL DEBRIDEMENT;  Surgeon: Cammy Copa, MD;  Location: [facility];  Service: Orthopedics;  Laterality: Right;   ANTERIOR CRUCIATE LIGAMENT REPAIR Left 04/19/2023   Procedure: LEFT KNEE ARTHROSCOPY ANTERIOR CRUCIATE LIGAMENT (ACL) RECONSTRUCTION WITH QUADRICEP GRAFT;  Surgeon: Cammy Copa, MD;  Location: [facility];  Service: Orthopedics;  Laterality: Left;   TONSILLECTOMY AND ADENOIDECTOMY Bilateral March 2011   Patient Active Problem List   Diagnosis Date Noted   Left anterior cruciate ligament tear 04/25/2023   Rupture of anterior cruciate ligament of right knee    Acute lateral meniscus tear of right knee    Complete tear of right ACL, initial encounter 03/18/2021   Acute medial meniscus tear of right knee 03/18/2021   Abnormal finding on EKG 06/19/2019   Need for prophylactic vaccination and inoculation against influenza 08/09/2018   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    Rhinitis, allergic 02/21/2015   Asthma, moderate persistent 02/21/2015   Encounter for follow-up 02/21/2015   Need for HPV vaccination 02/21/2015   Vaccine counseling 02/21/2015   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, requiring support (level 1) 12/07/2013   Developmental disorder 05/11/2013    PCP: Ronnald Nian, MD  REFERRING PROVIDER: Julieanne Cotton, PA-C  REFERRING DIAG: [phone] (ICD-10-CM) - S/P ACL reconstruction   THERAPY DIAG:  Muscle weakness (generalized)  Acute pain of left knee  Stiffness of left knee, not elsewhere classified  Other abnormalities of gait and mobility  Localized edema  Rationale for Evaluation and Treatment: Rehabilitation  ONSET DATE: 04/19/2023 Left knee ACL arthroscopy  SUBJECTIVE:   SUBJECTIVE STATEMENT: He relays not much pain overall today, walking is getting better, gets some numbness intermittently  PERTINENT HISTORY: Left ACL reconstruction with quad autograft 04/19/23, right ACL reconstruction with quad autograft 04/07/21, asthma, 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, requiring support, developmental disorder  PAIN:  NPRS scale: 2-3/10 today Pain location: left knee inside joint Pain description: sharp & achy Aggravating factors: put any weight on it, bending knee Relieving factors: rest, meds more Aspirin but some Oxy  PRECAUTIONS: Knee  WEIGHT BEARING RESTRICTIONS: Yes LLE WBAT  FALLS:  Has patient fallen in last 6 months? Only with initial injury  LIVING ENVIRONMENT: Lives with: lives with their family and 2 dogs ~100#   [place]: [address]  [place]  with basement (his bedroom) but staying upstairs for now. Stairs: Yes: Internal: 14 steps; on right going up and ramp at main entrance Has following equipment at home: Crutches and Bledsoe brace  OCCUPATION: works at [employer] - lift up to 75#, on feet for 5 hours  PLOF: Independent  PATIENT  GOALS:  return to work,  function in community.  Next MD visit:  05/24/2023  OBJECTIVE:  DIAGNOSTIC FINDINGS: MRI 03/29/23: impression - 1. Complete ACL tear. 2. No meniscal tear of the left knee. 3. Osseous contusions of the posterolateral tibial plateau, posteromedial tibial plateau and peripheral corner of the medial femoral condyle. 4. Large joint effusion.  PATIENT SURVEYS:  FOTO intake:  43%  predicted:  69%  COGNITION: Overall cognitive status: WFL    SENSATION: WFL  EDEMA:  Left knee & calf edematous.    PALPATION: Tenderness to touch along entire joint line, patella tendon and quadriceps tendon.  LOWER EXTREMITY ROM:   ROM Right eval Left eval Left 05/06/23 Left/Right Active 05/14/2023 Left  Hip flexion       Hip extension       Hip abduction       Hip adduction       Hip internal rotation       Hip external rotation       Knee flexion  P: 72* P: 85 78/142 86 AROM heelslide sitting  Knee extension  P: -6*  0/0   Ankle dorsiflexion       Ankle plantarflexion       Ankle inversion       Ankle eversion        (Blank rows = not tested)  LOWER EXTREMITY MMT:  MMT Right eval Left eval  Hip flexion    Hip extension    Hip abduction    Hip adduction    Hip internal rotation    Hip external rotation    Knee flexion  3-/5  Knee extension  3-/5  Ankle dorsiflexion    Ankle plantarflexion    Ankle inversion    Ankle eversion     (Blank rows = not tested)  FUNCTIONAL TESTS:  18 inch chair transfer: Modified independent utilizing bilateral upper extremities with no weight on left lower extremity.   GAIT: Distance walked: 100' Assistive device utilized: Crutches Bledsoe brace locked in extension Level of assistance: SBA / verbal cues Comments: Nonweightbearing left lower extremity   TODAY'S TREATMENT                                                                          DATE: 05/18/23 Therex: SciFit Bike seat 13 BLEs & BUEs full revolutions  Bledsoe brace in place but unlocked. Seated heel slides 5 sec hold  ext and flexion with RLE end range hamstring set 5 sec X 10 BFR training cuff size 3 with LOP 180 mmHG in sitting, exercised at 80% for following Quad sets with BFR 30,15,15,15 with 30 sec rest intervals BFR training cuff size 3 with LOP 222 mmHG in standing, exercised at 80% for following Standing Terminal Knee Ext with green theraband with BFR 30,15,15,15 March walking in bars with bilat UE support 3 trips with knee brace unlocked  Retro walking in bars with bilat UE support 3 trips with knee brace unlocked Sidestepping in bars with bilat UE support 3 trips with knee brace unlocked -Vasopnuematic device X 10 min, medium compression, 34 deg to Lt knee   05/13/2023 BFR with quadriceps sets supine  BFR training cuff size 3 with LOP 180 mmHG in sitting, exercised at 80% for following Quad sets with BFR 30,15,15,15 with 30 sec rest intervals Seated hamstrings slide 10 x 5 seconds Seated hamstrings isometrics 10 x 5 seconds  Vaso left knee 5 minutes 34* Medium Pressure   PATIENT EDUCATION:  Education details: HEP, POC Person educated: Patient Education method: Programmer, multimedia, Demonstration, Verbal cues, and Handouts Education comprehension: verbalized understanding, returned demonstration, and verbal cues required  HOME EXERCISE PROGRAM: Access Code: [email or web address] URL: [email or web address] Date: 05/14/2023 Prepared by: Pauletta Browns  Exercises - Standing Quad Set  - 3-4 x daily - 7 x weekly - 2-3 sets - 10 reps - 5 seconds hold - Seated Quad Set  - 3-4 x daily - 7 x weekly - 2-3 sets - 10 reps - 5 seconds hold - Ankle Alphabet in Elevation  - 2 x daily - 7 x weekly - 1 sets - >15 min hold - Seated Hamstring Set  - 3 x daily - 7 x weekly - 10 reps - 5 seconds hold - Seated Heel Slide  - 3 x daily - 7 x weekly - 10 reps - 5 seconds hold  ASSESSMENT: CLINICAL IMPRESSION: He showed  improved knee flexion ROM today but will need continued work to improve this with PT and with HEP. Quad control is improving with gait activity as well. He will continue to benefit from skilled PT to improve function.  OBJECTIVE IMPAIRMENTS: Abnormal gait, decreased activity tolerance, decreased balance, decreased knowledge of use of DME, decreased mobility, difficulty walking, decreased ROM, decreased strength, increased edema, increased muscle spasms, and pain.   ACTIVITY LIMITATIONS: carrying, lifting, bending, standing, squatting, stairs, transfers, and locomotion level  PARTICIPATION LIMITATIONS: meal prep, cleaning, laundry, community activity, and occupation  PERSONAL FACTORS: 1-2 comorbidities: see PMH  are also affecting patient's functional outcome.   REHAB POTENTIAL: Good  CLINICAL DECISION MAKING: Stable/uncomplicated  EVALUATION COMPLEXITY: Low   GOALS: Goals reviewed with patient? Yes  SHORT TERM GOALS: (target date for Short term goals 06/02/2022)   1.  Patient will demonstrate independent use of home exercise program to maintain progress from in clinic treatments. Baseline: See objective data Goal status: Met 05/14/2023  2. PROM left knee 0* ext to 100* flexion Baseline: See objective data Goal status: Partially Met 05/14/2023  3. Interim FOTO 50% Baseline: See objective data Goal status: Ongoing 05/11/2023  LONG TERM GOALS: (target dates for all long term goals  07/08/2022 )   1. Patient will demonstrate/report pain at worst less than or equal to 2/10 to facilitate minimal limitation in daily activity secondary to pain symptoms. Baseline: See objective data Goal status: Ongoing 05/11/2023   2. Patient will demonstrate independent use of home exercise program to facilitate ability to maintain/progress functional gains from skilled physical therapy services. Baseline: See objective data Goal status:Ongoing 05/11/2023   3. Patient will demonstrate FOTO outcome >  or = 69 % to indicate reduced disability due to condition. Baseline: See objective data Goal status: Ongoing 05/11/2023   4.  Patient will demonstrate left LE MMT 5/5 throughout to faciltiate usual transfers, stairs, squatting at [facility] for daily life.  Baseline: See objective data Goal status: Ongoing 05/11/2023  5.  Patient ambulates >500' without device and negotiates ramps, curbs and stairs single rail alternating pattern modified independent.  Baseline: See objective data Goal status: Ongoing 05/11/2023   6.  Left knee AROM 0* - 105* Baseline: See objective data Goal status: Ongoing 05/11/2023   7.  patient demonstrates ability to safely perform work related tasks.  Baseline: See objective data Goal status:   Ongoing 05/11/2023  PLAN:  PT FREQUENCY:  2-3x/week  PT DURATION: 10 weeks  PLANNED INTERVENTIONS: Therapeutic exercises, Therapeutic activity, Neuro Muscular re-education, Balance training, Gait training, Patient/Family education, Joint mobilization, Stair training, DME instructions, Dry Needling, Electrical stimulation, Traction, Cryotherapy, vasopneumatic deviceMoist heat, Taping, Ultrasound, Ionotophoresis 4mg /ml Dexamethasone, and aquatic therapy, Manual therapy.  All included unless contraindicated  PLAN FOR NEXT SESSION:  Blood flow restriction exercises.  SciFit bike.   Gait training in clinic with crutches PWB with unlocked knee brace.  Quadriceps and hamstrings strengthening, knee flexion AROM.   April Manson, PT, DPT 05/18/2023, 11:48 AM

## 2023-05-19 ENCOUNTER — Encounter: Payer: Self-pay | Admitting: Physical Therapy

## 2023-05-19 ENCOUNTER — Ambulatory Visit (INDEPENDENT_AMBULATORY_CARE_PROVIDER_SITE_OTHER): Payer: BC Managed Care – PPO | Admitting: Physical Therapy

## 2023-05-19 DIAGNOSIS — M25562 Pain in left knee: Secondary | ICD-10-CM | POA: Diagnosis not present

## 2023-05-19 DIAGNOSIS — R600 Localized edema: Secondary | ICD-10-CM

## 2023-05-19 DIAGNOSIS — M25662 Stiffness of left knee, not elsewhere classified: Secondary | ICD-10-CM | POA: Diagnosis not present

## 2023-05-19 DIAGNOSIS — M6281 Muscle weakness (generalized): Secondary | ICD-10-CM

## 2023-05-19 DIAGNOSIS — R2689 Other abnormalities of gait and mobility: Secondary | ICD-10-CM | POA: Diagnosis not present

## 2023-05-19 NOTE — Therapy (Signed)
OUTPATIENT PHYSICAL THERAPY TREATMENT  Patient Name: Jesse Howe MRN: [id number] DOB:2006/02/08, 17 y.o., male Today's Date: 05/19/2023  END OF SESSION:  PT End of Session - 05/19/23 1308     Visit Number 6    Number of Visits 25    Date for PT Re-Evaluation 07/09/23    Authorization Type BCBS & Medicaid    Progress Note Due on Visit 10    PT Start Time 1300    PT Stop Time 1345    PT Time Calculation (min) 45 min    Activity Tolerance Patient tolerated treatment well;No increased pain    Behavior During Therapy [facility] for tasks assessed/performed              Past Medical History:  Diagnosis Date   Allergy    RHINITIS   Asperger syndrome    sees psychiatry and counseling   Asthma    [employer]reesia 08/16/2020   Developmental disorder    Insomnia    Past Surgical History:  Procedure Laterality Date   ANTERIOR CRUCIATE LIGAMENT REPAIR Right 04/07/2021   Procedure: RIGHT KNEE RECONSTRUCTION ANTERIOR CRUCIATE LIGAMENT (ACL) WITH QUADRICEPS AUTOGRAFT, LATERAL MENISCAL DEBRIDEMENT;  Surgeon: Cammy Copa, MD;  Location: [facility];  Service: Orthopedics;  Laterality: Right;   ANTERIOR CRUCIATE LIGAMENT REPAIR Left 04/19/2023   Procedure: LEFT KNEE ARTHROSCOPY ANTERIOR CRUCIATE LIGAMENT (ACL) RECONSTRUCTION WITH QUADRICEP GRAFT;  Surgeon: Cammy Copa, MD;  Location: [facility];  Service: Orthopedics;  Laterality: Left;   TONSILLECTOMY AND ADENOIDECTOMY Bilateral March 2011   Patient Active Problem List   Diagnosis Date Noted   Left anterior cruciate ligament tear 04/25/2023   Rupture of anterior cruciate ligament of right knee    Acute lateral meniscus tear of right knee    Complete tear of right ACL, initial encounter 03/18/2021   Acute medial meniscus tear of right knee 03/18/2021   Abnormal finding on EKG 06/19/2019   Need for prophylactic vaccination and inoculation against influenza 08/09/2018   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    Rhinitis, allergic 02/21/2015   Asthma, moderate persistent 02/21/2015   Encounter for follow-up 02/21/2015   Need for HPV vaccination 02/21/2015   Vaccine counseling 02/21/2015   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, requiring support (level 1) 12/07/2013   Developmental disorder 05/11/2013    PCP: Ronnald Nian, MD  REFERRING PROVIDER: Julieanne Cotton, PA-C  REFERRING DIAG: [id number] (ICD-10-CM) - S/P ACL reconstruction   THERAPY DIAG:  Muscle weakness (generalized)  Acute pain of left knee  Stiffness of left knee, not elsewhere classified  Other abnormalities of gait and mobility  Localized edema  Rationale for Evaluation and Treatment: Rehabilitation  ONSET DATE: 04/19/2023 Left knee ACL arthroscopy  SUBJECTIVE:   SUBJECTIVE STATEMENT: He relays not too much soreness after yesterdays visit  PERTINENT HISTORY: Left ACL reconstruction with quad autograft 04/19/23, right ACL reconstruction with quad autograft 04/07/21, asthma, 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, requiring support, developmental disorder  PAIN:  NPRS scale: 2-3/10 today Pain location: left knee inside joint Pain description: sharp & achy Aggravating factors: put any weight on it, bending knee Relieving factors: rest, meds more Aspirin but some Oxy  PRECAUTIONS: Knee  WEIGHT BEARING RESTRICTIONS: Yes LLE WBAT  FALLS:  Has patient fallen in last 6 months? Only with initial injury  LIVING ENVIRONMENT: Lives with: lives with their family and 2 dogs ~100#   Lives in: [place] with basement (his bedroom) but staying  upstairs for now. Stairs: Yes: Internal: 14 steps; on right going up and ramp at main entrance Has following equipment at home: Crutches and Bledsoe brace  OCCUPATION: works at [employer] - lift up to 75#, on feet for 5 hours  PLOF: Independent  PATIENT GOALS:  return to work,  function in  community.  Next MD visit:  05/24/2023  OBJECTIVE:  DIAGNOSTIC FINDINGS: MRI 03/29/23: impression - 1. Complete ACL tear. 2. No meniscal tear of the left knee. 3. Osseous contusions of the posterolateral tibial plateau, posteromedial tibial plateau and peripheral corner of the medial femoral condyle. 4. Large joint effusion.  PATIENT SURVEYS:  FOTO intake:  43%  predicted:  69%  COGNITION: Overall cognitive status: WFL    SENSATION: WFL  EDEMA:  Left knee & calf edematous.    PALPATION: Tenderness to touch along entire joint line, patella tendon and quadriceps tendon.  LOWER EXTREMITY ROM:   ROM Right eval Left eval Left 05/06/23 Left/Right Active 05/14/2023 Left  Hip flexion       Hip extension       Hip abduction       Hip adduction       Hip internal rotation       Hip external rotation       Knee flexion  P: 72* P: 85 78/142 86 AROM heelslide sitting  Knee extension  P: -6*  0/0   Ankle dorsiflexion       Ankle plantarflexion       Ankle inversion       Ankle eversion        (Blank rows = not tested)  LOWER EXTREMITY MMT:  MMT Right eval Left eval  Hip flexion    Hip extension    Hip abduction    Hip adduction    Hip internal rotation    Hip external rotation    Knee flexion  3-/5  Knee extension  3-/5  Ankle dorsiflexion    Ankle plantarflexion    Ankle inversion    Ankle eversion     (Blank rows = not tested)  FUNCTIONAL TESTS:  18 inch chair transfer: Modified independent utilizing bilateral upper extremities with no weight on left lower extremity.   GAIT: Distance walked: 100' Assistive device utilized: Crutches Bledsoe brace locked in extension Level of assistance: SBA / verbal cues Comments: Nonweightbearing left lower extremity   TODAY'S TREATMENT                                                                          DATE: 05/19/23 Therex: SciFit Bike seat 13 BLEs & BUEs full revolutions Bledsoe brace in place but  unlocked. Seated heel slides 5 sec hold  ext and flexion with RLE end range hamstring set 5 sec X 10 Sit to stand X 10, some weight shift to left leg as tolerated Marches with bilat UE support at counter X 10 bilat in brace with it unlocked Sidestepping at counter with bilat UE support 3 trips with knee brace unlocked BFR training cuff size 3 with LOP 180 mmHG in sitting, exercised at 80% for following Quad sets with BFR 30,15,15,15 with 30 sec rest intervals  -Vasopnuematic device X 10 min,  medium compression, 34 deg to Lt knee  05/18/23 Therex: SciFit Bike seat 13 BLEs & BUEs full revolutions Bledsoe brace in place but unlocked. Seated heel slides 5 sec hold  ext and flexion with RLE end range hamstring set 5 sec X 10 BFR training cuff size 3 with LOP 180 mmHG in sitting, exercised at 80% for following Quad sets with BFR 30,15,15,15 with 30 sec rest intervals BFR training cuff size 3 with LOP 222 mmHG in standing, exercised at 80% for following Standing Terminal Knee Ext with green theraband with BFR 30,15,15,15 March walking in bars with bilat UE support 3 trips with knee brace unlocked Retro walking in bars with bilat UE support 3 trips with knee brace unlocked Sidestepping in bars with bilat UE support 3 trips with knee brace unlocked -Vasopnuematic device X 10 min, medium compression, 34 deg to Lt knee   05/13/2023 BFR with quadriceps sets supine  BFR training cuff size 3 with LOP 180 mmHG in sitting, exercised at 80% for following Quad sets with BFR 30,15,15,15 with 30 sec rest intervals Seated hamstrings slide 10 x 5 seconds Seated hamstrings isometrics 10 x 5 seconds  Vaso left knee 5 minutes 34* Medium Pressure   PATIENT EDUCATION:  Education details: HEP, POC Person educated: Patient Education method: Programmer, multimedia, Demonstration, Verbal cues, and Handouts Education comprehension: verbalized understanding, returned demonstration, and  verbal cues required  HOME EXERCISE PROGRAM: Access Code: [email or web address] URL: [email or web address] Date: 05/14/2023 Prepared by: Pauletta Browns  Exercises - Standing Quad Set  - 3-4 x daily - 7 x weekly - 2-3 sets - 10 reps - 5 seconds hold - Seated Quad Set  - 3-4 x daily - 7 x weekly - 2-3 sets - 10 reps - 5 seconds hold - Ankle Alphabet in Elevation  - 2 x daily - 7 x weekly - 1 sets - >15 min hold - Seated Hamstring Set  - 3 x daily - 7 x weekly - 10 reps - 5 seconds hold - Seated Heel Slide  - 3 x daily - 7 x weekly - 10 reps - 5 seconds hold  ASSESSMENT: CLINICAL IMPRESSION: Continue to work to improve knee ROM, quad strength, standing and gait activities as tolerated.   OBJECTIVE IMPAIRMENTS: Abnormal gait, decreased activity tolerance, decreased balance, decreased knowledge of use of DME, decreased mobility, difficulty walking, decreased ROM, decreased strength, increased edema, increased muscle spasms, and pain.   ACTIVITY LIMITATIONS: carrying, lifting, bending, standing, squatting, stairs, transfers, and locomotion level  PARTICIPATION LIMITATIONS: meal prep, cleaning, laundry, community activity, and occupation  PERSONAL FACTORS: 1-2 comorbidities: see PMH  are also affecting patient's functional outcome.   REHAB POTENTIAL: Good  CLINICAL DECISION MAKING: Stable/uncomplicated  EVALUATION COMPLEXITY: Low   GOALS: Goals reviewed with patient? Yes  SHORT TERM GOALS: (target date for Short term goals 06/02/2022)   1.  Patient will demonstrate independent use of home exercise program to maintain progress from in clinic treatments. Baseline: See objective data Goal status: Met 05/14/2023  2. PROM left knee 0* ext to 100* flexion Baseline: See objective data Goal status: Partially Met 05/14/2023  3. Interim FOTO 50% Baseline: See objective data Goal status: Ongoing 05/11/2023  LONG TERM GOALS: (target dates for all long term goals  07/08/2022 )   1.  Patient will demonstrate/report pain at worst less than or equal to 2/10 to facilitate minimal limitation in daily activity secondary to pain symptoms. Baseline: See objective data Goal status: Ongoing 05/11/2023  2. Patient will demonstrate independent use of home exercise program to facilitate ability to maintain/progress functional gains from skilled physical therapy services. Baseline: See objective data Goal status:Ongoing 05/11/2023   3. Patient will demonstrate FOTO outcome > or = 69 % to indicate reduced disability due to condition. Baseline: See objective data Goal status: Ongoing 05/11/2023   4.  Patient will demonstrate left LE MMT 5/5 throughout to faciltiate usual transfers, stairs, squatting at [facility] for daily life.  Baseline: See objective data Goal status: Ongoing 05/11/2023   5.  Patient ambulates >500' without device and negotiates ramps, curbs and stairs single rail alternating pattern modified independent.  Baseline: See objective data Goal status: Ongoing 05/11/2023   6.  Left knee AROM 0* - 105* Baseline: See objective data Goal status: Ongoing 05/11/2023   7.  patient demonstrates ability to safely perform work related tasks.  Baseline: See objective data Goal status:   Ongoing 05/11/2023  PLAN:  PT FREQUENCY:  2-3x/week  PT DURATION: 10 weeks  PLANNED INTERVENTIONS: Therapeutic exercises, Therapeutic activity, Neuro Muscular re-education, Balance training, Gait training, Patient/Family education, Joint mobilization, Stair training, DME instructions, Dry Needling, Electrical stimulation, Traction, Cryotherapy, vasopneumatic deviceMoist heat, Taping, Ultrasound, Ionotophoresis 4mg /ml Dexamethasone, and aquatic therapy, Manual therapy.  All included unless contraindicated  PLAN FOR NEXT SESSION:  Blood flow restriction exercises.  SciFit bike.   Gait training in clinic with crutches WBAT with unlocked knee brace.  Quadriceps and hamstrings strengthening, knee  flexion AROM.   April Manson, PT, DPT 05/19/2023, 1:10 PM

## 2023-05-20 ENCOUNTER — Encounter: Payer: Self-pay | Admitting: Physical Therapy

## 2023-05-20 ENCOUNTER — Ambulatory Visit (INDEPENDENT_AMBULATORY_CARE_PROVIDER_SITE_OTHER): Payer: BC Managed Care – PPO | Admitting: Physical Therapy

## 2023-05-20 DIAGNOSIS — M25662 Stiffness of left knee, not elsewhere classified: Secondary | ICD-10-CM

## 2023-05-20 DIAGNOSIS — M25562 Pain in left knee: Secondary | ICD-10-CM

## 2023-05-20 DIAGNOSIS — M6281 Muscle weakness (generalized): Secondary | ICD-10-CM | POA: Diagnosis not present

## 2023-05-20 DIAGNOSIS — R600 Localized edema: Secondary | ICD-10-CM

## 2023-05-20 DIAGNOSIS — R2689 Other abnormalities of gait and mobility: Secondary | ICD-10-CM | POA: Diagnosis not present

## 2023-05-20 NOTE — Therapy (Signed)
OUTPATIENT PHYSICAL THERAPY TREATMENT  Patient Name: Jesse Howe MRN: [id number] DOB:11/04/2005, 17 y.o., male Today's Date: 05/20/2023  END OF SESSION:  PT End of Session - 05/20/23 1149     Visit Number 7    Number of Visits 25    Date for PT Re-Evaluation 07/09/23    Authorization Type BCBS & Medicaid    Progress Note Due on Visit 10    PT Start Time 1100    PT Stop Time 1155    PT Time Calculation (min) 55 min    Activity Tolerance Patient tolerated treatment well;No increased pain    Behavior During Therapy [facility] for tasks assessed/performed               Past Medical History:  Diagnosis Date   Allergy    RHINITIS   Asperger syndrome    sees psychiatry and counseling   Asthma    [employer] 08/16/2020   Developmental disorder    Insomnia    Past Surgical History:  Procedure Laterality Date   ANTERIOR CRUCIATE LIGAMENT REPAIR Right 04/07/2021   Procedure: RIGHT KNEE RECONSTRUCTION ANTERIOR CRUCIATE LIGAMENT (ACL) WITH QUADRICEPS AUTOGRAFT, LATERAL MENISCAL DEBRIDEMENT;  Surgeon: Cammy Copa, MD;  Location: [facility];  Service: Orthopedics;  Laterality: Right;   ANTERIOR CRUCIATE LIGAMENT REPAIR Left 04/19/2023   Procedure: LEFT KNEE ARTHROSCOPY ANTERIOR CRUCIATE LIGAMENT (ACL) RECONSTRUCTION WITH QUADRICEP GRAFT;  Surgeon: Cammy Copa, MD;  Location: [facility];  Service: Orthopedics;  Laterality: Left;   TONSILLECTOMY AND ADENOIDECTOMY Bilateral March 2011   Patient Active Problem List   Diagnosis Date Noted   Left anterior cruciate ligament tear 04/25/2023   Rupture of anterior cruciate ligament of right knee    Acute lateral meniscus tear of right knee    Complete tear of right ACL, initial encounter 03/18/2021   Acute medial meniscus tear of right knee 03/18/2021   Abnormal finding on EKG 06/19/2019   Need for prophylactic vaccination and inoculation against influenza 08/09/2018   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    Rhinitis, allergic 02/21/2015   Asthma, moderate persistent 02/21/2015   Encounter for follow-up 02/21/2015   Need for HPV vaccination 02/21/2015   Vaccine counseling 02/21/2015   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, requiring support (level 1) 12/07/2013   Developmental disorder 05/11/2013    PCP: Ronnald Nian, MD  REFERRING PROVIDER: Julieanne Cotton, PA-C  REFERRING DIAG: [phone] (ICD-10-CM) - S/P ACL reconstruction   THERAPY DIAG:  Muscle weakness (generalized)  Acute pain of left knee  Stiffness of left knee, not elsewhere classified  Other abnormalities of gait and mobility  Localized edema  Rationale for Evaluation and Treatment: Rehabilitation  ONSET DATE: 04/19/2023 Left knee ACL arthroscopy  SUBJECTIVE:   SUBJECTIVE STATEMENT: He denies pain upon first arriving  PERTINENT HISTORY: Left ACL reconstruction with quad autograft 04/19/23, right ACL reconstruction with quad autograft 04/07/21, asthma, 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, requiring support, developmental disorder  PAIN:  NPRS scale: 2-3/10 today Pain location: left knee inside joint Pain description: sharp & achy Aggravating factors: put any weight on it, bending knee Relieving factors: rest, meds more Aspirin but some Oxy  PRECAUTIONS: Knee  WEIGHT BEARING RESTRICTIONS: Yes LLE WBAT  FALLS:  Has patient fallen in last 6 months? Only with initial injury  LIVING ENVIRONMENT: Lives with: lives with their family and 2 dogs ~100#   Lives in: [place] with basement (his bedroom) but staying upstairs for  now. Stairs: Yes: Internal: 14 steps; on right going up and ramp at main entrance Has following equipment at home: Crutches and Bledsoe brace  OCCUPATION: works at [employer] - lift up to 75#, on feet for 5 hours  PLOF: Independent  PATIENT GOALS:  return to work,  function in community.  Next MD visit:   05/24/2023  OBJECTIVE:  DIAGNOSTIC FINDINGS: MRI 03/29/23: impression - 1. Complete ACL tear. 2. No meniscal tear of the left knee. 3. Osseous contusions of the posterolateral tibial plateau, posteromedial tibial plateau and peripheral corner of the medial femoral condyle. 4. Large joint effusion.  PATIENT SURVEYS:  FOTO intake:  43%  predicted:  69%  COGNITION: Overall cognitive status: WFL    SENSATION: WFL  EDEMA:  Left knee & calf edematous.    PALPATION: Tenderness to touch along entire joint line, patella tendon and quadriceps tendon.  LOWER EXTREMITY ROM:   ROM Right eval Left eval Left 05/06/23 Left/Right Active 05/14/2023 Left 05/19/23  Hip flexion       Hip extension       Hip abduction       Hip adduction       Hip internal rotation       Hip external rotation       Knee flexion  P: 72* P: 85 78/142 86 AROM heelslide sitting  Knee extension  P: -6*  0/0   Ankle dorsiflexion       Ankle plantarflexion       Ankle inversion       Ankle eversion        (Blank rows = not tested)  LOWER EXTREMITY MMT:  MMT Right eval Left eval  Hip flexion    Hip extension    Hip abduction    Hip adduction    Hip internal rotation    Hip external rotation    Knee flexion  3-/5  Knee extension  3-/5  Ankle dorsiflexion    Ankle plantarflexion    Ankle inversion    Ankle eversion     (Blank rows = not tested)  FUNCTIONAL TESTS:  18 inch chair transfer: Modified independent utilizing bilateral upper extremities with no weight on left lower extremity.   GAIT: Distance walked: 100' Assistive device utilized: Crutches Bledsoe brace locked in extension Level of assistance: SBA / verbal cues Comments: Nonweightbearing left lower extremity   TODAY'S TREATMENT                                                                          DATE: 05/20/23 Therex: SciFit Bike seat 13 BLEs & BUEs full revolutions Bledsoe brace in place but unlocked. Seated heel slides 5  sec hold  ext and flexion with RLE end range hamstring set 5 sec X 10 Sit to stand X 10, some weight shift to left leg as tolerated Marches with bilat UE support in //bars 3 round trips with  brace with it unlocked Retro walking with bilat UE support in //bars 3 round trips with  brace with it unlocked Sidestepping in bars with bilat UE support 3 trips with knee brace unlocked Walking in bars with brace locked and light one UE Support on bars 2 round trips  BFR training cuff size 3 with LOP 180 mmHG in sitting, exercised at 80% for following Quad sets with BFR 30,15,15,15 with 30 sec rest intervals  -Vasopnuematic device X 10 min, medium compression, 34 deg to Lt knee  05/19/23 Therex: SciFit Bike seat 13 BLEs & BUEs full revolutions Bledsoe brace in place but unlocked. Seated heel slides 5 sec hold  ext and flexion with RLE end range hamstring set 5 sec X 10 Sit to stand X 10, some weight shift to left leg as tolerated Marches with bilat UE support at counter X 10 bilat in brace with it unlocked Sidestepping at counter with bilat UE support 3 trips with knee brace unlocked BFR training cuff size 3 with LOP 222 mmHG in standing, exercised at 80% for following Standing Terminal Knee Ext with blue theraband with BFR 30,15,15,15 BFR training cuff size 3 with LOP 180 mmHG in sitting, exercised at 80% for following Quad sets with BFR 30,15,15,15 with 30 sec rest intervals   -Vasopnuematic device X 10 min, medium compression, 34 deg to Lt knee 05/18/23 Therex: SciFit Bike seat 13 BLEs & BUEs full revolutions Bledsoe brace in place but unlocked. Seated heel slides 5 sec hold  ext and flexion with RLE end range hamstring set 5 sec X 10 BFR training cuff size 3 with LOP 180 mmHG in sitting, exercised at 80% for following Quad sets with BFR 30,15,15,15 with 30 sec rest intervals BFR training cuff size 3 with LOP 222 mmHG in standing, exercised at 80% for  following Standing Terminal Knee Ext with green theraband with BFR 30,15,15,15 March walking in bars with bilat UE support 3 trips with knee brace unlocked Retro walking in bars with bilat UE support 3 trips with knee brace unlocked Sidestepping in bars with bilat UE support 3 trips with knee brace unlocked -Vasopnuematic device X 10 min, medium compression, 34 deg to Lt knee   05/13/2023 BFR with quadriceps sets supine  BFR training cuff size 3 with LOP 180 mmHG in sitting, exercised at 80% for following Quad sets with BFR 30,15,15,15 with 30 sec rest intervals Seated hamstrings slide 10 x 5 seconds Seated hamstrings isometrics 10 x 5 seconds  Vaso left knee 5 minutes 34* Medium Pressure   PATIENT EDUCATION:  Education details: HEP, POC Person educated: Patient Education method: Programmer, multimedia, Demonstration, Verbal cues, and Handouts Education comprehension: verbalized understanding, returned demonstration, and verbal cues required  HOME EXERCISE PROGRAM: Access Code: [email or web address] URL: [email or web address] Date: 05/14/2023 Prepared by: Pauletta Browns  Exercises - Standing Quad Set  - 3-4 x daily - 7 x weekly - 2-3 sets - 10 reps - 5 seconds hold - Seated Quad Set  - 3-4 x daily - 7 x weekly - 2-3 sets - 10 reps - 5 seconds hold - Ankle Alphabet in Elevation  - 2 x daily - 7 x weekly - 1 sets - >15 min hold - Seated Hamstring Set  - 3 x daily - 7 x weekly - 10 reps - 5 seconds hold - Seated Heel Slide  - 3 x daily - 7 x weekly - 10 reps - 5 seconds hold  ASSESSMENT: CLINICAL IMPRESSION: He is slowly progressing. He is still with limitations in quad strength, gait, and knee flexion ROM that we are working to improve as tolerated with PT.   OBJECTIVE IMPAIRMENTS: Abnormal gait, decreased activity tolerance, decreased balance, decreased knowledge of use of DME, decreased mobility, difficulty walking, decreased ROM, decreased  strength, increased edema, increased  muscle spasms, and pain.   ACTIVITY LIMITATIONS: carrying, lifting, bending, standing, squatting, stairs, transfers, and locomotion level  PARTICIPATION LIMITATIONS: meal prep, cleaning, laundry, community activity, and occupation  PERSONAL FACTORS: 1-2 comorbidities: see PMH  are also affecting patient's functional outcome.   REHAB POTENTIAL: Good  CLINICAL DECISION MAKING: Stable/uncomplicated  EVALUATION COMPLEXITY: Low   GOALS: Goals reviewed with patient? Yes  SHORT TERM GOALS: (target date for Short term goals 06/02/2022)   1.  Patient will demonstrate independent use of home exercise program to maintain progress from in clinic treatments. Baseline: See objective data Goal status: Met 05/14/2023  2. PROM left knee 0* ext to 100* flexion Baseline: See objective data Goal status: Partially Met 05/14/2023  3. Interim FOTO 50% Baseline: See objective data Goal status: Ongoing 05/11/2023  LONG TERM GOALS: (target dates for all long term goals  07/08/2022 )   1. Patient will demonstrate/report pain at worst less than or equal to 2/10 to facilitate minimal limitation in daily activity secondary to pain symptoms. Baseline: See objective data Goal status: Ongoing 05/11/2023   2. Patient will demonstrate independent use of home exercise program to facilitate ability to maintain/progress functional gains from skilled physical therapy services. Baseline: See objective data Goal status:Ongoing 05/11/2023   3. Patient will demonstrate FOTO outcome > or = 69 % to indicate reduced disability due to condition. Baseline: See objective data Goal status: Ongoing 05/11/2023   4.  Patient will demonstrate left LE MMT 5/5 throughout to faciltiate usual transfers, stairs, squatting at [facility] for daily life.  Baseline: See objective data Goal status: Ongoing 05/11/2023   5.  Patient ambulates >500' without device and negotiates ramps, curbs and stairs single rail alternating pattern modified  independent.  Baseline: See objective data Goal status: Ongoing 05/11/2023   6.  Left knee AROM 0* - 105* Baseline: See objective data Goal status: Ongoing 05/11/2023   7.  patient demonstrates ability to safely perform work related tasks.  Baseline: See objective data Goal status:   Ongoing 05/11/2023  PLAN:  PT FREQUENCY:  2-3x/week  PT DURATION: 10 weeks  PLANNED INTERVENTIONS: Therapeutic exercises, Therapeutic activity, Neuro Muscular re-education, Balance training, Gait training, Patient/Family education, Joint mobilization, Stair training, DME instructions, Dry Needling, Electrical stimulation, Traction, Cryotherapy, vasopneumatic deviceMoist heat, Taping, Ultrasound, Ionotophoresis 4mg /ml Dexamethasone, and aquatic therapy, Manual therapy.  All included unless contraindicated  PLAN FOR NEXT SESSION:   He will need MD progress note next visit.  Blood flow restriction exercises.  SciFit bike.   Gait training WBAT with unlocked knee brace if using crutches and locked knee brace with light UE support.  Quadriceps strengthening, knee flexion AROM.   April Manson, PT, DPT 05/20/2023, 11:49 AM

## 2023-05-24 ENCOUNTER — Encounter: Payer: Self-pay | Admitting: Physical Therapy

## 2023-05-24 ENCOUNTER — Encounter: Payer: BC Managed Care – PPO | Admitting: Orthopedic Surgery

## 2023-05-24 ENCOUNTER — Ambulatory Visit (INDEPENDENT_AMBULATORY_CARE_PROVIDER_SITE_OTHER): Payer: BC Managed Care – PPO | Admitting: Physical Therapy

## 2023-05-24 DIAGNOSIS — M25562 Pain in left knee: Secondary | ICD-10-CM | POA: Diagnosis not present

## 2023-05-24 DIAGNOSIS — R600 Localized edema: Secondary | ICD-10-CM

## 2023-05-24 DIAGNOSIS — M25662 Stiffness of left knee, not elsewhere classified: Secondary | ICD-10-CM

## 2023-05-24 DIAGNOSIS — R2689 Other abnormalities of gait and mobility: Secondary | ICD-10-CM | POA: Diagnosis not present

## 2023-05-24 DIAGNOSIS — M6281 Muscle weakness (generalized): Secondary | ICD-10-CM

## 2023-05-24 NOTE — Therapy (Signed)
OUTPATIENT PHYSICAL THERAPY TREATMENT  Patient Name: Jesse Howe MRN: [id number] DOB:Oct 30, 2005, 17 y.o., male Today's Date: 05/24/2023  END OF SESSION:  PT End of Session - 05/24/23 1016     Visit Number 8    Number of Visits 25    Date for PT Re-Evaluation 07/09/23    Authorization Type BCBS & Medicaid    Progress Note Due on Visit 10    PT Start Time 1014    PT Stop Time 1105    PT Time Calculation (min) 51 min    Activity Tolerance Patient tolerated treatment well;No increased pain    Behavior During Therapy [facility] for tasks assessed/performed                Past Medical History:  Diagnosis Date   Allergy    RHINITIS   Asperger syndrome    sees psychiatry and counseling   Asthma    [employer] 08/16/2020   Developmental disorder    Insomnia    Past Surgical History:  Procedure Laterality Date   ANTERIOR CRUCIATE LIGAMENT REPAIR Right 04/07/2021   Procedure: RIGHT KNEE RECONSTRUCTION ANTERIOR CRUCIATE LIGAMENT (ACL) WITH QUADRICEPS AUTOGRAFT, LATERAL MENISCAL DEBRIDEMENT;  Surgeon: Cammy Copa, MD;  Location: [facility];  Service: Orthopedics;  Laterality: Right;   ANTERIOR CRUCIATE LIGAMENT REPAIR Left 04/19/2023   Procedure: LEFT KNEE ARTHROSCOPY ANTERIOR CRUCIATE LIGAMENT (ACL) RECONSTRUCTION WITH QUADRICEP GRAFT;  Surgeon: Cammy Copa, MD;  Location: [facility];  Service: Orthopedics;  Laterality: Left;   TONSILLECTOMY AND ADENOIDECTOMY Bilateral March 2011   Patient Active Problem List   Diagnosis Date Noted   Left anterior cruciate ligament tear 04/25/2023   Rupture of anterior cruciate ligament of right knee    Acute lateral meniscus tear of right knee    Complete tear of right ACL, initial encounter 03/18/2021   Acute medial meniscus tear of right knee 03/18/2021   Abnormal finding on EKG 06/19/2019   Need for prophylactic vaccination and inoculation against influenza 08/09/2018   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    Rhinitis, allergic 02/21/2015   Asthma, moderate persistent 02/21/2015   Encounter for follow-up 02/21/2015   Need for HPV vaccination 02/21/2015   Vaccine counseling 02/21/2015   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, requiring support (level 1) 12/07/2013   Developmental disorder 05/11/2013    PCP: Ronnald Nian, MD  REFERRING PROVIDER: Julieanne Cotton, PA-C  REFERRING DIAG: [phone] (ICD-10-CM) - S/P ACL reconstruction   THERAPY DIAG:  Muscle weakness (generalized)  Acute pain of left knee  Stiffness of left knee, not elsewhere classified  Other abnormalities of gait and mobility  Localized edema  Rationale for Evaluation and Treatment: Rehabilitation  ONSET DATE: 04/19/2023 Left knee ACL arthroscopy  SUBJECTIVE:   SUBJECTIVE STATEMENT: Was a little more sore and had some discomfort the other day, but it feels better now  PERTINENT HISTORY: Left ACL reconstruction with quad autograft 04/19/23, right ACL reconstruction with quad autograft 04/07/21, asthma, 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, requiring support, developmental disorder  PAIN:  NPRS scale: 2-3/10 today Pain location: left knee inside joint Pain description: sharp & achy Aggravating factors: put any weight on it, bending knee Relieving factors: rest, meds more Aspirin but some Oxy  PRECAUTIONS: Knee  WEIGHT BEARING RESTRICTIONS: Yes LLE WBAT  FALLS:  Has patient fallen in last 6 months? Only with initial injury  LIVING ENVIRONMENT: Lives with: lives with their family and 2 dogs ~100#   Lives  in: House  ranch with basement (his bedroom) but staying upstairs for now. Stairs: Yes: Internal: 14 steps; on right going up and ramp at main entrance Has following equipment at home: Crutches and Bledsoe brace  OCCUPATION: works at [employer] - lift up to 75#, on feet for 5 hours  PLOF: Independent  PATIENT GOALS:  return to  work,  function in community.  Next MD visit:  05/24/2023  OBJECTIVE:  DIAGNOSTIC FINDINGS: MRI 03/29/23: impression - 1. Complete ACL tear. 2. No meniscal tear of the left knee. 3. Osseous contusions of the posterolateral tibial plateau, posteromedial tibial plateau and peripheral corner of the medial femoral condyle. 4. Large joint effusion.  PATIENT SURVEYS:  FOTO intake:  43%  predicted:  69%  COGNITION: Overall cognitive status: WFL    SENSATION: WFL  EDEMA:  Left knee & calf edematous.    PALPATION: Tenderness to touch along entire joint line, patella tendon and quadriceps tendon.  LOWER EXTREMITY ROM:   ROM Right eval Left eval Left 05/06/23 Left/Right Active 05/14/2023 Left 05/19/23 Left 05/24/23  Hip flexion        Hip extension        Hip abduction        Hip adduction        Hip internal rotation        Hip external rotation        Knee flexion  P: 72* P: 85 78/142 86 AROM heelslide sitting AA: 90   Knee extension  P: -6*  0/0    Ankle dorsiflexion        Ankle plantarflexion        Ankle inversion        Ankle eversion         (Blank rows = not tested)  LOWER EXTREMITY MMT:  MMT Right eval Left eval  Hip flexion    Hip extension    Hip abduction    Hip adduction    Hip internal rotation    Hip external rotation    Knee flexion  3-/5  Knee extension  3-/5  Ankle dorsiflexion    Ankle plantarflexion    Ankle inversion    Ankle eversion     (Blank rows = not tested)  FUNCTIONAL TESTS:  18 inch chair transfer: Modified independent utilizing bilateral upper extremities with no weight on left lower extremity.   GAIT: Distance walked: 100' Assistive device utilized: Crutches Bledsoe brace locked in extension Level of assistance: SBA / verbal cues Comments: Nonweightbearing left lower extremity   TODAY'S TREATMENT                                                                          DATE: 05/24/23 Therex: SciFit Bike seat 13 BLEs  & BUEs full revolutions Bledsoe brace in place but unlocked x 5 min Long sitting AA heel slides x 20 reps BFR training cuff size 3 with LOP 180 mmHG in sitting, exercised at 80% for following Quad sets with BFR 30,15,15,15 with 30 sec rest intervals Supine and Rt sidelying SLR 3x15 sec; 30 sec rest between sets  Modalities Vaso x 10 min to Lt knee; mod pressure 34 deg   05/20/23 Therex:  SciFit Bike seat 13 BLEs & BUEs full revolutions Bledsoe brace in place but unlocked. Seated heel slides 5 sec hold  ext and flexion with RLE end range hamstring set 5 sec X 10 Sit to stand X 10, some weight shift to left leg as tolerated Marches with bilat UE support in //bars 3 round trips with  brace with it unlocked Retro walking with bilat UE support in //bars 3 round trips with  brace with it unlocked Sidestepping in bars with bilat UE support 3 trips with knee brace unlocked Walking in bars with brace locked and light one UE Support on bars 2 round trips BFR training cuff size 3 with LOP 180 mmHG in sitting, exercised at 80% for following Quad sets with BFR 30,15,15,15 with 30 sec rest intervals  -Vasopnuematic device X 10 min, medium compression, 34 deg to Lt knee  05/19/23 Therex: SciFit Bike seat 13 BLEs & BUEs full revolutions Bledsoe brace in place but unlocked. Seated heel slides 5 sec hold  ext and flexion with RLE end range hamstring set 5 sec X 10 Sit to stand X 10, some weight shift to left leg as tolerated Marches with bilat UE support at counter X 10 bilat in brace with it unlocked Sidestepping at counter with bilat UE support 3 trips with knee brace unlocked BFR training cuff size 3 with LOP 222 mmHG in standing, exercised at 80% for following Standing Terminal Knee Ext with blue theraband with BFR 30,15,15,15 BFR training cuff size 3 with LOP 180 mmHG in sitting, exercised at 80% for following Quad sets with BFR 30,15,15,15 with 30 sec rest  intervals   -Vasopnuematic device X 10 min, medium compression, 34 deg to Lt knee   PATIENT EDUCATION:  Education details: HEP, POC Person educated: Patient Education method: Programmer, multimedia, Demonstration, Verbal cues, and Handouts Education comprehension: verbalized understanding, returned demonstration, and verbal cues required  HOME EXERCISE PROGRAM: Access Code: [email or web address] URL: [email or web address] Date: 05/14/2023 Prepared by: Pauletta Browns  Exercises - Standing Quad Set  - 3-4 x daily - 7 x weekly - 2-3 sets - 10 reps - 5 seconds hold - Seated Quad Set  - 3-4 x daily - 7 x weekly - 2-3 sets - 10 reps - 5 seconds hold - Ankle Alphabet in Elevation  - 2 x daily - 7 x weekly - 1 sets - >15 min hold - Seated Hamstring Set  - 3 x daily - 7 x weekly - 10 reps - 5 seconds hold - Seated Heel Slide  - 3 x daily - 7 x weekly - 10 reps - 5 seconds hold  ASSESSMENT: CLINICAL IMPRESSION: Good improvement in ROM from last week despite limited work at home due to some increase in pain.  Overall slowly progressing with PT.  Will continue to benefit from PT to maximize function.   OBJECTIVE IMPAIRMENTS: Abnormal gait, decreased activity tolerance, decreased balance, decreased knowledge of use of DME, decreased mobility, difficulty walking, decreased ROM, decreased strength, increased edema, increased muscle spasms, and pain.   ACTIVITY LIMITATIONS: carrying, lifting, bending, standing, squatting, stairs, transfers, and locomotion level  PARTICIPATION LIMITATIONS: meal prep, cleaning, laundry, community activity, and occupation  PERSONAL FACTORS: 1-2 comorbidities: see PMH  are also affecting patient's functional outcome.   REHAB POTENTIAL: Good  CLINICAL DECISION MAKING: Stable/uncomplicated  EVALUATION COMPLEXITY: Low   GOALS: Goals reviewed with patient? Yes  SHORT TERM GOALS: (target date for Short term goals 06/02/2022)   1.  Patient will  demonstrate independent use  of home exercise program to maintain progress from in clinic treatments. Baseline: See objective data Goal status: Met 05/14/2023  2. PROM left knee 0* ext to 100* flexion Baseline: See objective data Goal status: Partially Met 05/14/2023  3. Interim FOTO 50% Baseline: See objective data Goal status: Ongoing 05/11/2023  LONG TERM GOALS: (target dates for all long term goals  07/08/2022 )   1. Patient will demonstrate/report pain at worst less than or equal to 2/10 to facilitate minimal limitation in daily activity secondary to pain symptoms. Baseline: See objective data Goal status: Ongoing 05/11/2023   2. Patient will demonstrate independent use of home exercise program to facilitate ability to maintain/progress functional gains from skilled physical therapy services. Baseline: See objective data Goal status:Ongoing 05/11/2023   3. Patient will demonstrate FOTO outcome > or = 69 % to indicate reduced disability due to condition. Baseline: See objective data Goal status: Ongoing 05/11/2023   4.  Patient will demonstrate left LE MMT 5/5 throughout to faciltiate usual transfers, stairs, squatting at [facility] for daily life.  Baseline: See objective data Goal status: Ongoing 05/11/2023   5.  Patient ambulates >500' without device and negotiates ramps, curbs and stairs single rail alternating pattern modified independent.  Baseline: See objective data Goal status: Ongoing 05/11/2023   6.  Left knee AROM 0* - 105* Baseline: See objective data Goal status: Ongoing 05/11/2023   7.  patient demonstrates ability to safely perform work related tasks.  Baseline: See objective data Goal status:   Ongoing 05/11/2023  PLAN:  PT FREQUENCY:  2-3x/week  PT DURATION: 10 weeks  PLANNED INTERVENTIONS: Therapeutic exercises, Therapeutic activity, Neuro Muscular re-education, Balance training, Gait training, Patient/Family education, Joint mobilization, Stair training, DME instructions, Dry  Needling, Electrical stimulation, Traction, Cryotherapy, vasopneumatic deviceMoist heat, Taping, Ultrasound, Ionotophoresis 4mg /ml Dexamethasone, and aquatic therapy, Manual therapy.  All included unless contraindicated  PLAN FOR NEXT SESSION:   See what MD says  Blood flow restriction exercises.  SciFit bike.   Gait training WBAT with unlocked knee brace if using crutches and locked knee brace with light UE support.  Quadriceps strengthening, knee flexion AROM.   Moshe Cipro, PT, DPT 05/24/2023, 11:02 AM

## 2023-05-27 ENCOUNTER — Encounter: Payer: BC Managed Care – PPO | Admitting: Rehabilitative and Restorative Service Providers"

## 2023-05-31 ENCOUNTER — Ambulatory Visit (INDEPENDENT_AMBULATORY_CARE_PROVIDER_SITE_OTHER): Payer: BC Managed Care – PPO | Admitting: Physical Therapy

## 2023-05-31 ENCOUNTER — Encounter: Payer: Self-pay | Admitting: Physical Therapy

## 2023-05-31 DIAGNOSIS — M25662 Stiffness of left knee, not elsewhere classified: Secondary | ICD-10-CM | POA: Diagnosis not present

## 2023-05-31 DIAGNOSIS — R2689 Other abnormalities of gait and mobility: Secondary | ICD-10-CM

## 2023-05-31 DIAGNOSIS — M6281 Muscle weakness (generalized): Secondary | ICD-10-CM

## 2023-05-31 DIAGNOSIS — M25562 Pain in left knee: Secondary | ICD-10-CM

## 2023-05-31 DIAGNOSIS — R600 Localized edema: Secondary | ICD-10-CM

## 2023-05-31 NOTE — Therapy (Signed)
OUTPATIENT PHYSICAL THERAPY TREATMENT  Patient Name: Jesse Howe MRN: [id number] DOB:01-04-2006, 17 y.o., male Today's Date: 05/31/2023  END OF SESSION:  PT End of Session - 05/31/23 1435     Visit Number 9    Number of Visits 25    Date for PT Re-Evaluation 07/09/23    Authorization Type BCBS & Medicaid    Progress Note Due on Visit 10    PT Start Time 1430    PT Stop Time 1525    PT Time Calculation (min) 55 min    Activity Tolerance Patient tolerated treatment well;No increased pain    Behavior During Therapy [facility] for tasks assessed/performed                 Past Medical History:  Diagnosis Date   Allergy    RHINITIS   Asperger syndrome    sees psychiatry and counseling   Asthma    [employer] 08/16/2020   Developmental disorder    Insomnia    Past Surgical History:  Procedure Laterality Date   ANTERIOR CRUCIATE LIGAMENT REPAIR Right 04/07/2021   Procedure: RIGHT KNEE RECONSTRUCTION ANTERIOR CRUCIATE LIGAMENT (ACL) WITH QUADRICEPS AUTOGRAFT, LATERAL MENISCAL DEBRIDEMENT;  Surgeon: Cammy Copa, MD;  Location: [facility];  Service: Orthopedics;  Laterality: Right;   ANTERIOR CRUCIATE LIGAMENT REPAIR Left 04/19/2023   Procedure: LEFT KNEE ARTHROSCOPY ANTERIOR CRUCIATE LIGAMENT (ACL) RECONSTRUCTION WITH QUADRICEP GRAFT;  Surgeon: Cammy Copa, MD;  Location: [facility];  Service: Orthopedics;  Laterality: Left;   TONSILLECTOMY AND ADENOIDECTOMY Bilateral March 2011   Patient Active Problem List   Diagnosis Date Noted   Left anterior cruciate ligament tear 04/25/2023   Rupture of anterior cruciate ligament of right knee    Acute lateral meniscus tear of right knee    Complete tear of right ACL, initial encounter 03/18/2021   Acute medial meniscus tear of right knee 03/18/2021   Abnormal finding on EKG 06/19/2019   Need for prophylactic vaccination and inoculation against influenza 08/09/2018   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    Rhinitis, allergic 02/21/2015   Asthma, moderate persistent 02/21/2015   Encounter for follow-up 02/21/2015   Need for HPV vaccination 02/21/2015   Vaccine counseling 02/21/2015   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  He is icing knee also which improves his exercises.   PERTINENT HISTORY: Left ACL reconstruction with quad autograft 04/19/23, right ACL reconstruction with quad autograft 04/07/21, asthma, 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, requiring support, developmental disorder  PAIN:  NPRS scale:  2/10 today, in last week 0/10 most of time up to 3-4/10 Pain location: left knee inside joint Pain description: sharp & achy Aggravating factors: put any weight on it, bending knee Relieving factors: rest, meds more Aspirin but some Oxy  PRECAUTIONS: Knee  WEIGHT BEARING RESTRICTIONS: Yes LLE WBAT  FALLS:  Has patient fallen in last 6 months? Only with initial injury  LIVING ENVIRONMENT: Lives  with: lives with their family and 2 dogs ~100#   Lives in: [place] with basement (his bedroom) but staying upstairs for now. Stairs: Yes: Internal: 14 steps; on right going up and ramp at main entrance Has following equipment at home: Crutches and Bledsoe brace  OCCUPATION: works at [employer] - lift up to 75#, on feet for 5  hours  PLOF: Independent  PATIENT GOALS:  return to work,  function in community.  Next MD visit:  05/24/2023  OBJECTIVE:  DIAGNOSTIC FINDINGS: MRI 03/29/23: impression - 1. Complete ACL tear. 2. No meniscal tear of the left knee. 3. Osseous contusions of the posterolateral tibial plateau, posteromedial tibial plateau and peripheral corner of the medial femoral condyle. 4. Large joint effusion.  PATIENT SURVEYS:  FOTO intake:  43%  predicted:  69%  COGNITION: Overall cognitive status: WFL    SENSATION: WFL  EDEMA:  Left knee & calf edematous.    PALPATION: Tenderness to touch along entire joint line, patella tendon and quadriceps tendon.  LOWER EXTREMITY ROM:   ROM Right eval Left eval Left 05/06/23 Left/Right Active 05/14/2023 Left 05/19/23 Left 05/24/23  Hip flexion        Hip extension        Hip abduction        Hip adduction        Hip internal rotation        Hip external rotation        Knee flexion  P: 72* P: 85 78/142 86 AROM heelslide sitting AA: 90   Knee extension  P: -6*  0/0    Ankle dorsiflexion        Ankle plantarflexion        Ankle inversion        Ankle eversion         (Blank rows = not tested)  LOWER EXTREMITY MMT:  MMT Right eval Left eval  Hip flexion    Hip extension    Hip abduction    Hip adduction    Hip internal rotation    Hip external rotation    Knee flexion  3-/5  Knee extension  3-/5  Ankle dorsiflexion    Ankle plantarflexion    Ankle inversion    Ankle eversion     (Blank rows = not tested)  FUNCTIONAL TESTS:  18 inch chair transfer: Modified independent utilizing bilateral upper extremities with no weight on left lower extremity.   GAIT: Distance walked: 100' Assistive device utilized: Crutches Bledsoe brace locked in extension Level of assistance: SBA / verbal cues Comments: Nonweightbearing left lower extremity   TODAY'S TREATMENT                                                                           DATE: 05/31/2023 Therapeutic Exercise: Precor Recumbent Bike seat 10 partial revolutions 4 min and full revolutions backwards 2 min, forward 2 min LLE Step up forward concentric & backwards eccentric  6" box BUE support //bars 10 reps Tandem stance on foam beam 30 sec ea with LLE in front & in back.  Rocker board with intermittent UE support //bars BLE knee flexion & extension for anterior/midline/posterior and right/midline/left for 1 min  each.   BFR training cuff size 3 with LOP 222 mmHG in standing, exercised at 80% for following Standing Terminal Knee Ext with green theraband with BFR 11,91,47,82     Gait Training: Pre-gait in //bars with mirror & PT demo/ verbal cues. Focus on terminal stance / swing knee flexion and stance /weight bearing knee extension.  Pt amb in //bars focus on carryover above and equal step length.  Pt amb 70' with crutches no brace with carryover of above with step through pattern.   Modalities Vaso x 10 min to Lt knee; medium pressure 34 deg    TREATMENT                                                                          DATE: 05/24/23 Therex: SciFit Bike seat 13 BLEs & BUEs full revolutions Bledsoe brace in place but unlocked x 5 min Long sitting AA heel slides x 20 reps BFR training cuff size 3 with LOP 180 mmHG in sitting, exercised at 80% for following Quad sets with BFR 30,15,15,15 with 30 sec rest intervals Supine and Rt sidelying SLR 3x15 sec; 30 sec rest between sets  Modalities Vaso x 10 min to Lt knee; mod pressure 34 deg   05/20/23 Therex: SciFit Bike seat 13 BLEs & BUEs full revolutions Bledsoe brace in place but unlocked. Seated heel slides 5 sec hold  ext and flexion with RLE end range hamstring set 5 sec X 10 Sit to stand X 10, some weight shift to left leg as tolerated Marches with bilat UE support in //bars 3 round trips with  brace with it unlocked Retro walking with bilat UE support in //bars 3 round  trips with  brace with it unlocked Sidestepping in bars with bilat UE support 3 trips with knee brace unlocked Walking in bars with brace locked and light one UE Support on bars 2 round trips BFR training cuff size 3 with LOP 180 mmHG in sitting, exercised at 80% for following Quad sets with BFR 30,15,15,15 with 30 sec rest intervals  -Vasopnuematic device X 10 min, medium compression, 34 deg to Lt knee  05/19/23 Therex: SciFit Bike seat 13 BLEs & BUEs full revolutions Bledsoe brace in place but unlocked. Seated heel slides 5 sec hold  ext and flexion with RLE end range hamstring set 5 sec X 10 Sit to stand X 10, some weight shift to left leg as tolerated Marches with bilat UE support at counter X 10 bilat in brace with it unlocked Sidestepping at counter with bilat UE support 3 trips with knee brace unlocked BFR training cuff size 3 with LOP 222 mmHG in standing, exercised at 80% for following Standing Terminal Knee Ext with blue theraband with BFR 30,15,15,15 BFR training cuff size 3 with LOP 180 mmHG in sitting, exercised at 80% for following Quad sets with BFR 30,15,15,15 with 30 sec rest intervals   -Vasopnuematic device X 10 min, medium compression, 34 deg to Lt knee   PATIENT EDUCATION:  Education details: HEP, POC Person educated: Patient Education method: Programmer, multimedia, Demonstration, Verbal cues, and Handouts Education comprehension: verbalized understanding, returned demonstration, and verbal cues required  HOME EXERCISE PROGRAM: Access Code: [email or web address]  URL: [email or web address] Date: 05/31/2023 Prepared by: Vladimir Faster  Exercises - Standing Quad Set  - 3-4 x daily - 7 x weekly - 2-3 sets - 10 reps - 5 seconds hold - Seated Quad Set  - 3-4 x daily - 7 x weekly - 2-3 sets - 10 reps - 5 seconds hold - Ankle Alphabet in Elevation  - 2 x daily - 7 x weekly - 1 sets - >15 min hold - Seated Hamstring Set  - 3 x daily - 7 x weekly - 10  reps - 5 seconds hold - Seated Heel Slide  - 3 x daily - 7 x weekly - 10 reps - 5 seconds hold - standing knee extension  - 1 x daily - 7 x weekly - 4 sets - 30-15 reps - 5 seconds hold   ASSESSMENT: CLINICAL IMPRESSION: PT progressed standing functional activities today which he tolerated well.  He had no issues with knee instability during gait without knee brace using crutches and slow pace for focus on knee control & pattern.  Will continue to benefit from PT to maximize function.  OBJECTIVE IMPAIRMENTS: Abnormal gait, decreased activity tolerance, decreased balance, decreased knowledge of use of DME, decreased mobility, difficulty walking, decreased ROM, decreased strength, increased edema, increased muscle spasms, and pain.   ACTIVITY LIMITATIONS: carrying, lifting, bending, standing, squatting, stairs, transfers, and locomotion level  PARTICIPATION LIMITATIONS: meal prep, cleaning, laundry, community activity, and occupation  PERSONAL FACTORS: 1-2 comorbidities: see PMH  are also affecting patient's functional outcome.   REHAB POTENTIAL: Good  CLINICAL DECISION MAKING: Stable/uncomplicated  EVALUATION COMPLEXITY: Low   GOALS: Goals reviewed with patient? Yes  SHORT TERM GOALS: (target date for Short term goals 06/02/2022)   1.  Patient will demonstrate independent use of home exercise program to maintain progress from in clinic treatments. Baseline: See objective data Goal status: Met 05/14/2023  2. PROM left knee 0* ext to 100* flexion Baseline: See objective data Goal status: Partially Met 05/14/2023  3. Interim FOTO 50% Baseline: See objective data Goal status: Ongoing 05/11/2023  LONG TERM GOALS: (target dates for all long term goals  07/08/2022 )   1. Patient will demonstrate/report pain at worst less than or equal to 2/10 to facilitate minimal limitation in daily activity secondary to pain symptoms. Baseline: See objective data Goal status: Ongoing 05/11/2023    2. Patient will demonstrate independent use of home exercise program to facilitate ability to maintain/progress functional gains from skilled physical therapy services. Baseline: See objective data Goal status:Ongoing 05/11/2023   3. Patient will demonstrate FOTO outcome > or = 69 % to indicate reduced disability due to condition. Baseline: See objective data Goal status: Ongoing 05/11/2023   4.  Patient will demonstrate left LE MMT 5/5 throughout to faciltiate usual transfers, stairs, squatting at [facility] for daily life.  Baseline: See objective data Goal status: Ongoing 05/11/2023   5.  Patient ambulates >500' without device and negotiates ramps, curbs and stairs single rail alternating pattern modified independent.  Baseline: See objective data Goal status: Ongoing 05/11/2023   6.  Left knee AROM 0* - 105* Baseline: See objective data Goal status: Ongoing 05/11/2023   7.  patient demonstrates ability to safely perform work related tasks.  Baseline: See objective data Goal status:   Ongoing 05/11/2023  PLAN:  PT FREQUENCY:  2-3x/week  PT DURATION: 10 weeks  PLANNED INTERVENTIONS: Therapeutic exercises, Therapeutic activity, Neuro Muscular re-education, Balance training, Gait training, Patient/Family education, Joint mobilization, Stair training, DME instructions,  Dry Needling, Electrical stimulation, Traction, Cryotherapy, vasopneumatic deviceMoist heat, Taping, Ultrasound, Ionotophoresis 4mg /ml Dexamethasone, and aquatic therapy, Manual therapy.  All included unless contraindicated  PLAN FOR NEXT SESSION:   Do interim FOTO, Blood flow restriction exercises.   bike.   Gait training WBAT  Functional Quadriceps strengthening, knee flexion AROM.   Vladimir Faster, PT, DPT 05/31/2023, 4:52 PM

## 2023-06-03 ENCOUNTER — Encounter: Payer: Self-pay | Admitting: Physical Therapy

## 2023-06-03 ENCOUNTER — Ambulatory Visit (INDEPENDENT_AMBULATORY_CARE_PROVIDER_SITE_OTHER): Payer: BC Managed Care – PPO | Admitting: Physical Therapy

## 2023-06-03 DIAGNOSIS — M25562 Pain in left knee: Secondary | ICD-10-CM

## 2023-06-03 DIAGNOSIS — R600 Localized edema: Secondary | ICD-10-CM | POA: Diagnosis not present

## 2023-06-03 DIAGNOSIS — R2689 Other abnormalities of gait and mobility: Secondary | ICD-10-CM

## 2023-06-03 DIAGNOSIS — M6281 Muscle weakness (generalized): Secondary | ICD-10-CM | POA: Diagnosis not present

## 2023-06-03 DIAGNOSIS — M25662 Stiffness of left knee, not elsewhere classified: Secondary | ICD-10-CM

## 2023-06-03 NOTE — Therapy (Signed)
 OUTPATIENT PHYSICAL THERAPY TREATMENT Progress Note reporting period 05/05/23 to 06/03/23  See below for objective and subjective measurements relating to patients progress with PT.   Patient Name: Jesse Howe MRN: [id number] DOB:10-30-2005, 18 y.o., male Today's Date: 06/03/2023  END OF SESSION:  PT End of Session - 06/03/23 1348     Visit Number 10    Number of Visits 25    Date for PT Re-Evaluation 07/09/23    Authorization Type BCBS & Medicaid    Progress Note Due on Visit 20    PT Start Time 1300    PT Stop Time 1350    PT Time Calculation (min) 50 min    Activity Tolerance Patient tolerated treatment well;No increased pain    Behavior During Therapy [facility] for tasks assessed/performed                  Past Medical History:  Diagnosis Date   Allergy    RHINITIS   Asperger syndrome    sees psychiatry and counseling   Asthma    [employer] 08/16/2020   Developmental disorder    Insomnia    Past Surgical History:  Procedure Laterality Date   ANTERIOR CRUCIATE LIGAMENT REPAIR Right 04/07/2021   Procedure: RIGHT KNEE RECONSTRUCTION ANTERIOR CRUCIATE LIGAMENT (ACL) WITH QUADRICEPS AUTOGRAFT, LATERAL MENISCAL DEBRIDEMENT;  Surgeon: Addie Cordella Hamilton, MD;  Location: [facility];  Service: Orthopedics;  Laterality: Right;   ANTERIOR CRUCIATE LIGAMENT REPAIR Left 04/19/2023   Procedure: LEFT KNEE ARTHROSCOPY ANTERIOR CRUCIATE LIGAMENT (ACL) RECONSTRUCTION WITH QUADRICEP GRAFT;  Surgeon: Addie Cordella Hamilton, MD;  Location: [facility] OR;  Service: Orthopedics;  Laterality: Left;   TONSILLECTOMY AND ADENOIDECTOMY Bilateral March 2011   Patient Active Problem List   Diagnosis Date Noted   Left anterior cruciate ligament tear 04/25/2023   Rupture of anterior cruciate ligament of right knee    Acute lateral meniscus tear of right knee    Complete tear of right ACL, initial encounter 03/18/2021   Acute medial meniscus tear of right knee 03/18/2021   Abnormal finding  on EKG 06/19/2019   Need for prophylactic vaccination and inoculation against influenza 08/09/2018   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   Rhinitis, allergic 02/21/2015   Asthma, moderate persistent 02/21/2015   Encounter for follow-up 02/21/2015   Need for HPV vaccination 02/21/2015   Vaccine counseling 02/21/2015   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 He is walking with one crutch only at home in knee brace. Still uses 2 crutches for outside home.  PERTINENT HISTORY: Left ACL reconstruction with quad autograft 04/19/23, right ACL reconstruction with quad autograft 04/07/21, asthma, 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, requiring support, developmental disorder  PAIN:  NPRS scale:  2/10 today, in last week 0/10 most of time up to 3-4/10 Pain location: left knee inside joint Pain description: sharp & achy Aggravating factors: put any weight on it, bending knee Relieving factors: rest,  meds more Aspirin  but some Oxy  PRECAUTIONS: Knee  WEIGHT BEARING RESTRICTIONS: Yes LLE WBAT  FALLS:  Has patient fallen in last 6 months? Only with initial injury  LIVING ENVIRONMENT: Lives with: lives with their family and 2 dogs ~100#   Lives in: [place] with basement (his bedroom) but staying upstairs for now. Stairs: Yes:  Internal: 14 steps; on right going up and ramp at main entrance Has following equipment at home: Crutches and Bledsoe brace  OCCUPATION: works at [employer] - lift up to 75#, on feet for 5 hours  PLOF: Independent  PATIENT GOALS:  return to work,  function in community.  Next MD visit:  05/24/2023  OBJECTIVE:  DIAGNOSTIC FINDINGS: MRI 03/29/23: impression - 1. Complete ACL tear. 2. No meniscal tear of the left knee. 3. Osseous contusions of the posterolateral tibial plateau, posteromedial tibial plateau and peripheral corner of the medial femoral condyle. 4. Large joint effusion.  PATIENT SURVEYS:  FOTO intake:  43%  predicted:  69%  COGNITION: Overall cognitive status: WFL    SENSATION: WFL  EDEMA:  Left knee & calf edematous.    PALPATION: Tenderness to touch along entire joint line, patella tendon and quadriceps tendon.  LOWER EXTREMITY ROM:   ROM Right eval Left eval Left 05/06/23 Left/Right Active 05/14/2023 Left 05/19/23 Left 05/24/23 Left 06/02/22  Hip flexion         Hip extension         Hip abduction         Hip adduction         Hip internal rotation         Hip external rotation         Knee flexion  P: 72* P: 85 78/142 86 AROM heelslide sitting AA: 90  A:95 P:99  Knee extension  P: -6*  0/0     Ankle dorsiflexion         Ankle plantarflexion         Ankle inversion         Ankle eversion          (Blank rows = not tested)  LOWER EXTREMITY MMT:  MMT Right eval Left eval Left 06/02/22  Hip flexion     Hip extension     Hip abduction     Hip adduction     Hip internal rotation     Hip external rotation     Knee flexion  3-/5 4  Knee extension  3-/5 4  Ankle dorsiflexion     Ankle plantarflexion     Ankle inversion     Ankle eversion      (Blank rows = not tested)  FUNCTIONAL TESTS:  18 inch chair transfer: Modified independent utilizing bilateral upper extremities with no weight on left lower extremity.   GAIT: Distance  walked: 100' Assistive device utilized: Crutches Bledsoe brace locked in extension Level of assistance: SBA / verbal cues Comments: Nonweightbearing left lower extremity   TODAY'S TREATMENT                                                                          DATE: 06/03/2023 Therapeutic Exercise: Precor Recumbent Bike seat 10 partial  revolutions 4 min then moved seat back to #11 and able to perform full revolutions  3 minutes LLE Step up forward concentric & backwards eccentric  6 box BUE support //bars 15 reps Lateral step ups 6 inch step with bilat UE support in bars X 10 Walking with one crutch and no brace 3 round trips in bars Sidestepping in bars without UE support 3 round trips Seated SLR 2X15 Leg press DL 49# stretching into max flexion and holding 5 sec each rep X 10, then Left leg only 25# 2X10  Manual therapy for Left knee PROM knee flexion with overpressure mobs  -Vasopnuematic device X 10 min, medium compression, 34 deg to Lt knee    05/31/2023 Therapeutic Exercise: Precor Recumbent Bike seat 10 partial revolutions 4 min and full revolutions backwards 2 min, forward 2 min LLE Step up forward concentric & backwards eccentric  6 box BUE support //bars 10 reps Tandem stance on foam beam 30 sec ea with LLE in front & in back.  Rocker board with intermittent UE support //bars BLE knee flexion & extension for anterior/midline/posterior and right/midline/left for 1 min each.   BFR training cuff size 3 with LOP 222 mmHG in standing, exercised at 80% for following Standing Terminal Knee Ext with green theraband with BFR 69,84,84,84  Gait Training: Pre-gait in //bars with mirror & PT demo/ verbal cues. Focus on terminal stance / swing knee flexion and stance /weight bearing knee extension.  Pt amb in //bars focus on carryover above and equal step length.  Pt amb 70' with crutches no brace with carryover of above with step through pattern.   Modalities Vaso x 10 min  to Lt knee; medium pressure 34 deg    TREATMENT                                                                          DATE: 05/24/23 Therex: SciFit Bike seat 13 BLEs & BUEs full revolutions Bledsoe brace in place but unlocked x 5 min Long sitting AA heel slides x 20 reps BFR training cuff size 3 with LOP 180 mmHG in sitting, exercised at 80% for following Quad sets with BFR 30,15,15,15 with 30 sec rest intervals Supine and Rt sidelying SLR 3x15 sec; 30 sec rest between sets  Modalities Vaso x 10 min to Lt knee; mod pressure 34 deg   PATIENT EDUCATION:  Education details: HEP, POC Person educated: Patient Education method: Programmer, Multimedia, Demonstration, Verbal cues, and Handouts Education comprehension: verbalized understanding, returned demonstration, and verbal cues required  HOME EXERCISE PROGRAM: Access Code: [email or web address] URL: [email or web address] Date: 05/31/2023 Prepared by: Grayce Spatz  Exercises - Standing Quad Set  - 3-4 x daily - 7 x weekly - 2-3 sets - 10 reps - 5 seconds hold - Seated Quad Set  - 3-4 x daily - 7 x weekly - 2-3 sets - 10 reps - 5 seconds hold - Ankle Alphabet in Elevation  - 2 x daily - 7 x weekly - 1 sets - >15 min hold - Seated Hamstring Set  - 3 x daily - 7 x weekly - 10 reps - 5 seconds hold - Seated Heel Slide  - 3 x daily - 7 x weekly -  10 reps - 5 seconds hold - standing knee extension  - 1 x daily - 7 x weekly - 4 sets - 30-15 reps - 5 seconds hold   ASSESSMENT: CLINICAL IMPRESSION: He is getting stronger but this is progressing slowly. Knee flexion ROM measurements  and knee strength measurements have improved but still with deficits. PT recommends he decreased from 2 crutches to one crutch in knee brace. This will also help improve strengthening and weight bearing tolerance. PT will continue to work to improve his strength and knee ROM as tolerated to improve function.   OBJECTIVE IMPAIRMENTS: Abnormal gait, decreased  activity tolerance, decreased balance, decreased knowledge of use of DME, decreased mobility, difficulty walking, decreased ROM, decreased strength, increased edema, increased muscle spasms, and pain.   ACTIVITY LIMITATIONS: carrying, lifting, bending, standing, squatting, stairs, transfers, and locomotion level  PARTICIPATION LIMITATIONS: meal prep, cleaning, laundry, community activity, and occupation  PERSONAL FACTORS: 1-2 comorbidities: see PMH  are also affecting patient's functional outcome.   REHAB POTENTIAL: Good  CLINICAL DECISION MAKING: Stable/uncomplicated  EVALUATION COMPLEXITY: Low   GOALS: Goals reviewed with patient? Yes  SHORT TERM GOALS: (target date for Short term goals 06/02/2022)   1.  Patient will demonstrate independent use of home exercise program to maintain progress from in clinic treatments. Baseline: See objective data Goal status: Met 05/14/2023  2. PROM left knee 0* ext to 100* flexion Baseline: See objective data Goal status: Partially Met 05/14/2023  3. Interim FOTO 50% Baseline: See objective data Goal status: Ongoing 05/11/2023  LONG TERM GOALS: (target dates for all long term goals  07/08/2022 )   1. Patient will demonstrate/report pain at worst less than or equal to 2/10 to facilitate minimal limitation in daily activity secondary to pain symptoms. Baseline: See objective data Goal status: Ongoing 05/11/2023   2. Patient will demonstrate independent use of home exercise program to facilitate ability to maintain/progress functional gains from skilled physical therapy services. Baseline: See objective data Goal status:Ongoing 05/11/2023   3. Patient will demonstrate FOTO outcome > or = 69 % to indicate reduced disability due to condition. Baseline: See objective data Goal status: Ongoing 05/11/2023   4.  Patient will demonstrate left LE MMT 5/5 throughout to faciltiate usual transfers, stairs, squatting at [facility] for daily life.  Baseline:  See objective data Goal status: Ongoing 05/11/2023   5.  Patient ambulates >500' without device and negotiates ramps, curbs and stairs single rail alternating pattern modified independent.  Baseline: See objective data Goal status: Ongoing 05/11/2023   6.  Left knee AROM 0* - 105* Baseline: See objective data Goal status: Ongoing 05/11/2023   7.  patient demonstrates ability to safely perform work related tasks.  Baseline: See objective data Goal status:   Ongoing 05/11/2023  PLAN:  PT FREQUENCY:  2-3x/week  PT DURATION: 10 weeks  PLANNED INTERVENTIONS: Therapeutic exercises, Therapeutic activity, Neuro Muscular re-education, Balance training, Gait training, Patient/Family education, Joint mobilization, Stair training, DME instructions, Dry Needling, Electrical stimulation, Traction, Cryotherapy, vasopneumatic deviceMoist heat, Taping, Ultrasound, Ionotophoresis 4mg /ml Dexamethasone , and aquatic therapy, Manual therapy.  All included unless contraindicated  PLAN FOR NEXT SESSION:   Do interim FOTO,  bike.   Gait training WBAT  Functional Quadriceps strengthening, knee flexion AROM.   Redell JONELLE Moose, PT, DPT 06/03/2023, 1:49 PM

## 2023-06-04 ENCOUNTER — Encounter: Payer: Self-pay | Admitting: Rehabilitative and Restorative Service Providers"

## 2023-06-04 ENCOUNTER — Ambulatory Visit (INDEPENDENT_AMBULATORY_CARE_PROVIDER_SITE_OTHER): Payer: BC Managed Care – PPO | Admitting: Rehabilitative and Restorative Service Providers"

## 2023-06-04 DIAGNOSIS — M25562 Pain in left knee: Secondary | ICD-10-CM

## 2023-06-04 DIAGNOSIS — R2689 Other abnormalities of gait and mobility: Secondary | ICD-10-CM | POA: Diagnosis not present

## 2023-06-04 DIAGNOSIS — M6281 Muscle weakness (generalized): Secondary | ICD-10-CM

## 2023-06-04 DIAGNOSIS — R600 Localized edema: Secondary | ICD-10-CM

## 2023-06-04 DIAGNOSIS — M25662 Stiffness of left knee, not elsewhere classified: Secondary | ICD-10-CM | POA: Diagnosis not present

## 2023-06-04 NOTE — Therapy (Signed)
 OUTPATIENT PHYSICAL THERAPY TREATMENT   Patient Name: Jesse Howe MRN: [id number] DOB:[birth year], 18 y.o., male Today's Date: 06/04/2023  END OF SESSION:  PT End of Session - 06/04/23 1310     Visit Number 11    Number of Visits 25    Date for PT Re-Evaluation 07/09/23    Authorization Type BCBS & Medicaid    Progress Note Due on Visit 20    PT Start Time 1307    PT Stop Time 1349    PT Time Calculation (min) 42 min    Activity Tolerance Patient tolerated treatment well;No increased pain    Behavior During Therapy [facility] for tasks assessed/performed             Past Medical History:  Diagnosis Date   Allergy    RHINITIS   Asperger syndrome    sees psychiatry and counseling   Asthma    [employer] 08/16/2020   Developmental disorder    Insomnia    Past Surgical History:  Procedure Laterality Date   ANTERIOR CRUCIATE LIGAMENT REPAIR Right 04/07/2021   Procedure: RIGHT KNEE RECONSTRUCTION ANTERIOR CRUCIATE LIGAMENT (ACL) WITH QUADRICEPS AUTOGRAFT, LATERAL MENISCAL DEBRIDEMENT;  Surgeon: Addie Cordella Hamilton, MD;  Location: [facility];  Service: Orthopedics;  Laterality: Right;   ANTERIOR CRUCIATE LIGAMENT REPAIR Left 04/19/2023   Procedure: LEFT KNEE ARTHROSCOPY ANTERIOR CRUCIATE LIGAMENT (ACL) RECONSTRUCTION WITH QUADRICEP GRAFT;  Surgeon: Addie Cordella Hamilton, MD;  Location: [facility];  Service: Orthopedics;  Laterality: Left;   TONSILLECTOMY AND ADENOIDECTOMY Bilateral March 2011   Patient Active Problem List   Diagnosis Date Noted   Left anterior cruciate ligament tear 04/25/2023   Rupture of anterior cruciate ligament of right knee    Acute lateral meniscus tear of right knee    Complete tear of right ACL, initial encounter 03/18/2021   Acute medial meniscus tear of right knee 03/18/2021   Abnormal finding on EKG 06/19/2019   Need for prophylactic vaccination and inoculation against influenza 08/09/2018   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    Rhinitis, allergic 02/21/2015   Asthma, moderate persistent 02/21/2015   Encounter for follow-up 02/21/2015   Need for HPV vaccination 02/21/2015   Vaccine counseling 02/21/2015   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, requiring support (level 1) 12/07/2013   Developmental disorder 05/11/2013    PCP: [name], MD  REFERRING PROVIDER: Shirly Carlin CROME, PA-C  REFERRING DIAG: [id number] (ICD-10-CM) - S/P ACL reconstruction   THERAPY DIAG:  Muscle weakness (generalized)  Acute pain of left knee  Stiffness of left knee, not elsewhere classified  Other abnormalities of gait and mobility  Localized edema  Rationale for Evaluation and Treatment: Rehabilitation  ONSET DATE: 04/19/2023 Left knee ACL arthroscopy  SUBJECTIVE:   SUBJECTIVE STATEMENT: Jesse Howe notes he is walking with the knee brace unlocked without difficulty.  An occasional CBD gummie for pain but no pain meds needed this week.  PERTINENT HISTORY: Left ACL reconstruction with quad autograft 04/19/23, right ACL reconstruction with quad autograft 04/07/21, asthma, 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, requiring support, developmental disorder  PAIN:  NPRS scale: 0-3/10 this week Pain location: Left knee inside joint Pain description: Achy Aggravating factors: flexion, too much WB Relieving factors: CBD gummie  PRECAUTIONS: Knee  WEIGHT BEARING RESTRICTIONS: Yes LLE WBAT  FALLS:  Has patient fallen in last 6 months? Only with initial injury  LIVING ENVIRONMENT: Lives with: lives with their family and 2 dogs ~100#   Lives in: [address]  [address] with basement (his bedroom) but staying upstairs for now. Stairs: Yes: Internal: 14 steps; on right going up and ramp at main entrance Has following equipment at home: Crutches and Bledsoe brace  OCCUPATION: works at [employer] - lift up to 75#, on feet for 5 hours  PLOF: Independent  PATIENT  GOALS:  return to work,  function in community.  Next MD visit:  05/24/2023  OBJECTIVE:  DIAGNOSTIC FINDINGS: MRI 03/29/23: impression - 1. Complete ACL tear. 2. No meniscal tear of the left knee. 3. Osseous contusions of the posterolateral tibial plateau, posteromedial tibial plateau and peripheral corner of the medial femoral condyle. 4. Large joint effusion.  PATIENT SURVEYS:  FOTO intake:  43%  predicted:  69%  COGNITION: Overall cognitive status: WFL    SENSATION: WFL  EDEMA:  Left knee & calf edematous.    PALPATION: Tenderness to touch along entire joint line, patella tendon and quadriceps tendon.  LOWER EXTREMITY ROM:   ROM Right eval Left eval Left 05/06/23 Left/Right Active 05/14/2023 Left 05/19/23 Left 05/24/23 Left 06/02/22  Hip flexion         Hip extension         Hip abduction         Hip adduction         Hip internal rotation         Hip external rotation         Knee flexion  P: 72* P: 85 78/142 86 AROM heelslide sitting AA: 90  A:95 P:99  Knee extension  P: -6*  0/0     Ankle dorsiflexion         Ankle plantarflexion         Ankle inversion         Ankle eversion          (Blank rows = not tested)  LOWER EXTREMITY MMT:  MMT Right eval Left eval Left 06/02/22  Hip flexion     Hip extension     Hip abduction     Hip adduction     Hip internal rotation     Hip external rotation     Knee flexion  3-/5 4  Knee extension  3-/5 4  Ankle dorsiflexion     Ankle plantarflexion     Ankle inversion     Ankle eversion      (Blank rows = not tested)  FUNCTIONAL TESTS:  18 inch chair transfer: Modified independent utilizing bilateral upper extremities with no weight on left lower extremity.   GAIT: Distance walked: 100' Assistive device utilized: Crutches Bledsoe brace locked in extension Level of assistance: SBA / verbal cues Comments: Nonweightbearing left lower extremity   TODAY'S TREATMENT                                                                           DATE: 06/04/2023 Recumbent bike Seat 10 for 8 minutes, AAROM to full revolutions Seated straight leg raises 2 sets of 5 Knee flexion machine 20# 10 x pull back with both, slowly back with left only  Functional Activities: Double leg press (sit to stand with slow eccentrics) 30/15/15/15 reps at 178 mm HG with Cuff 3, 50# for  30 reps and 56# for all 3 sets of 15 reps   06/03/2023 Therapeutic Exercise: Precor Recumbent Bike seat 10 partial revolutions 4 min then moved seat back to #11 and able to perform full revolutions  3 minutes LLE Step up forward concentric & backwards eccentric  6 box BUE support //bars 15 reps Lateral step ups 6 inch step with bilat UE support in bars X 10 Walking with one crutch and no brace 3 round trips in bars Sidestepping in bars without UE support 3 round trips Seated SLR 2X15 Leg press DL 49# stretching into max flexion and holding 5 sec each rep X 10, then Left leg only 25# 2X10  Manual therapy for Left knee PROM knee flexion with overpressure mobs  -Vasopnuematic device X 10 min, medium compression, 34 deg to Lt knee   05/31/2023 Therapeutic Exercise: Precor Recumbent Bike seat 10 partial revolutions 4 min and full revolutions backwards 2 min, forward 2 min LLE Step up forward concentric & backwards eccentric  6 box BUE support //bars 10 reps Tandem stance on foam beam 30 sec ea with LLE in front & in back.  Rocker board with intermittent UE support //bars BLE knee flexion & extension for anterior/midline/posterior and right/midline/left for 1 min each.   BFR training cuff size 3 with LOP 222 mmHG in standing, exercised at 80% for following Standing Terminal Knee Ext with green theraband with BFR 69,84,84,84  Gait Training: Pre-gait in //bars with mirror & PT demo/ verbal cues. Focus on terminal stance / swing knee flexion and stance /weight bearing knee extension.  Pt amb in //bars focus on carryover above and equal  step length.  Pt amb 70' with crutches no brace with carryover of above with step through pattern.   Modalities Vaso x 10 min to Lt knee; medium pressure 34 deg   PATIENT EDUCATION:  Education details: HEP, POC Person educated: Patient Education method: Programmer, Multimedia, Demonstration, Verbal cues, and Handouts Education comprehension: verbalized understanding, returned demonstration, and verbal cues required  HOME EXERCISE PROGRAM: Access Code: [email or web address] URL: [email or web address] Date: 05/31/2023 Prepared by: Grayce Spatz  Exercises - Standing Quad Set  - 3-4 x daily - 7 x weekly - 2-3 sets - 10 reps - 5 seconds hold - Seated Quad Set  - 3-4 x daily - 7 x weekly - 2-3 sets - 10 reps - 5 seconds hold - Ankle Alphabet in Elevation  - 2 x daily - 7 x weekly - 1 sets - >15 min hold - Seated Hamstring Set  - 3 x daily - 7 x weekly - 10 reps - 5 seconds hold - Seated Heel Slide  - 3 x daily - 7 x weekly - 10 reps - 5 seconds hold - standing knee extension  - 1 x daily - 7 x weekly - 4 sets - 30-15 reps - 5 seconds hold   ASSESSMENT: CLINICAL IMPRESSION: Bostyn did well with blood flow restriction, straight leg raises and resisted knee flexion today.  Heavy emphasis on strengthening today and he will still need proprioceptive and balance challenges to meet long-term goals.  Flexion AROM is also still a high priority.  OBJECTIVE IMPAIRMENTS: Abnormal gait, decreased activity tolerance, decreased balance, decreased knowledge of use of DME, decreased mobility, difficulty walking, decreased ROM, decreased strength, increased edema, increased muscle spasms, and pain.   ACTIVITY LIMITATIONS: carrying, lifting, bending, standing, squatting, stairs, transfers, and locomotion level  PARTICIPATION LIMITATIONS: meal prep, cleaning, laundry, community activity, and occupation  PERSONAL FACTORS:  1-2 comorbidities: see PMH  are also affecting patient's functional outcome.   REHAB  POTENTIAL: Good  CLINICAL DECISION MAKING: Stable/uncomplicated  EVALUATION COMPLEXITY: Low   GOALS: Goals reviewed with patient? Yes  SHORT TERM GOALS: (target date for Short term goals 06/02/2022)   1.  Patient will demonstrate independent use of home exercise program to maintain progress from in clinic treatments. Baseline: See objective data Goal status: Met 05/14/2023  2. PROM left knee 0* ext to 100* flexion Baseline: See objective data Goal status: Partially Met 05/14/2023  3. Interim FOTO 50% Baseline: See objective data Goal status: Ongoing 05/11/2023  LONG TERM GOALS: (target dates for all long term goals  07/08/2022 )   1. Patient will demonstrate/report pain at worst less than or equal to 2/10 to facilitate minimal limitation in daily activity secondary to pain symptoms. Baseline: See objective data Goal status: Ongoing 06/04/2023   2. Patient will demonstrate independent use of home exercise program to facilitate ability to maintain/progress functional gains from skilled physical therapy services. Baseline: See objective data Goal status:Ongoing 06/04/2023   3. Patient will demonstrate FOTO outcome > or = 69 % to indicate reduced disability due to condition. Baseline: See objective data Goal status: Ongoing 05/11/2023   4.  Patient will demonstrate left LE MMT 5/5 throughout to faciltiate usual transfers, stairs, squatting at [facility] for daily life.  Baseline: See objective data Goal status: Ongoing 06/04/2023   5.  Patient ambulates >500' without device and negotiates ramps, curbs and stairs single rail alternating pattern modified independent.  Baseline: See objective data Goal status: Ongoing 06/04/2023   6.  Left knee AROM 0* - 105* Baseline: See objective data Goal status: Ongoing 05/11/2023   7.  patient demonstrates ability to safely perform work related tasks.  Baseline: See objective data Goal status:   Ongoing 05/11/2023  PLAN:  PT FREQUENCY:   2-3x/week  PT DURATION: 10 weeks  PLANNED INTERVENTIONS: Therapeutic exercises, Therapeutic activity, Neuro Muscular re-education, Balance training, Gait training, Patient/Family education, Joint mobilization, Stair training, DME instructions, Dry Needling, Electrical stimulation, Traction, Cryotherapy, vasopneumatic deviceMoist heat, Taping, Ultrasound, Ionotophoresis 4mg /ml Dexamethasone , and aquatic therapy, Manual therapy.  All included unless contraindicated  PLAN FOR NEXT SESSION:   Do interim FOTO, bike.  Gait training WBAT, proprioception/balance,  Functional Quadriceps strengthening, knee flexion AROM.   Myer LELON Ivory, PT, MPT 06/04/2023, 1:53 PM

## 2023-06-07 ENCOUNTER — Encounter: Payer: Self-pay | Admitting: Physical Therapy

## 2023-06-07 ENCOUNTER — Ambulatory Visit: Payer: BC Managed Care – PPO | Admitting: Physical Therapy

## 2023-06-07 DIAGNOSIS — R2689 Other abnormalities of gait and mobility: Secondary | ICD-10-CM

## 2023-06-07 DIAGNOSIS — M6281 Muscle weakness (generalized): Secondary | ICD-10-CM | POA: Diagnosis not present

## 2023-06-07 DIAGNOSIS — R600 Localized edema: Secondary | ICD-10-CM

## 2023-06-07 DIAGNOSIS — M25662 Stiffness of left knee, not elsewhere classified: Secondary | ICD-10-CM

## 2023-06-07 DIAGNOSIS — M25562 Pain in left knee: Secondary | ICD-10-CM | POA: Diagnosis not present

## 2023-06-07 NOTE — Therapy (Signed)
 OUTPATIENT PHYSICAL THERAPY TREATMENT   Patient Name: Jesse Howe MRN: [id number] DOB:Feb 24, 2006, 18 y.o., male Today's Date: 06/07/2023  END OF SESSION:  PT End of Session - 06/07/23 1428     Visit Number 12    Number of Visits 25    Date for PT Re-Evaluation 07/09/23    Authorization Type BCBS & Medicaid    Progress Note Due on Visit 20    PT Start Time 1430    PT Stop Time 1523    PT Time Calculation (min) 53 min    Activity Tolerance Patient tolerated treatment well;No increased pain    Behavior During Therapy [facility] for tasks assessed/performed              Past Medical History:  Diagnosis Date   Allergy    RHINITIS   Asperger syndrome    sees psychiatry and counseling   Asthma    [employer] 08/16/2020   Developmental disorder    Insomnia    Past Surgical History:  Procedure Laterality Date   ANTERIOR CRUCIATE LIGAMENT REPAIR Right 04/07/2021   Procedure: RIGHT KNEE RECONSTRUCTION ANTERIOR CRUCIATE LIGAMENT (ACL) WITH QUADRICEPS AUTOGRAFT, LATERAL MENISCAL DEBRIDEMENT;  Surgeon: Addie Cordella Hamilton, MD;  Location: [facility];  Service: Orthopedics;  Laterality: Right;   ANTERIOR CRUCIATE LIGAMENT REPAIR Left 04/19/2023   Procedure: LEFT KNEE ARTHROSCOPY ANTERIOR CRUCIATE LIGAMENT (ACL) RECONSTRUCTION WITH QUADRICEP GRAFT;  Surgeon: Addie Cordella Hamilton, MD;  Location: [facility];  Service: Orthopedics;  Laterality: Left;   TONSILLECTOMY AND ADENOIDECTOMY Bilateral March 2011   Patient Active Problem List   Diagnosis Date Noted   Left anterior cruciate ligament tear 04/25/2023   Rupture of anterior cruciate ligament of right knee    Acute lateral meniscus tear of right knee    Complete tear of right ACL, initial encounter 03/18/2021   Acute medial meniscus tear of right knee 03/18/2021   Abnormal finding on EKG 06/19/2019   Need for prophylactic vaccination and inoculation against influenza 08/09/2018   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    Rhinitis, allergic 02/21/2015   Asthma, moderate persistent 02/21/2015   Encounter for follow-up 02/21/2015   Need for HPV vaccination 02/21/2015   Vaccine counseling 02/21/2015   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, requiring support (level 1) 12/07/2013   Developmental disorder 05/11/2013    PCP: [name], MD  REFERRING PROVIDER: Addie Cordella Hamilton, MD  REFERRING DIAG: [phone] (ICD-10-CM) - S/P ACL reconstruction   THERAPY DIAG:  Muscle weakness (generalized)  Acute pain of left knee  Stiffness of left knee, not elsewhere classified  Other abnormalities of gait and mobility  Localized edema  Rationale for Evaluation and Treatment: Rehabilitation  ONSET DATE: 04/19/2023 Left knee ACL arthroscopy  SUBJECTIVE:   SUBJECTIVE STATEMENT: He walks in home & community with crutches with KO unlocked.  He has been sleeping   PERTINENT HISTORY: Left ACL reconstruction with quad autograft 04/19/23, right ACL reconstruction with quad autograft 04/07/21, asthma, 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, requiring support, developmental disorder  PAIN:  NPRS scale:  0-3/10 this week Pain location: Left knee inside joint Pain description: Achy Aggravating factors: flexion, too much WB Relieving factors: CBD gummie  PRECAUTIONS: Knee  WEIGHT BEARING RESTRICTIONS: Yes LLE WBAT  FALLS:  Has patient fallen in last 6 months? Only with initial injury  LIVING ENVIRONMENT: Lives with: lives with their family and 2 dogs ~100#   Lives in: [place] with basement (his bedroom) but  staying upstairs for now. Stairs: Yes: Internal: 14 steps; on right going up and ramp at main entrance Has following equipment at home: Crutches and Bledsoe brace  OCCUPATION: works at [employer] - lift up to 75#, on feet for 5 hours  PLOF: Independent  PATIENT GOALS:  return to work,  function in community.  Next MD  visit:  05/24/2023  OBJECTIVE:  DIAGNOSTIC FINDINGS: MRI 03/29/23: impression - 1. Complete ACL tear. 2. No meniscal tear of the left knee. 3. Osseous contusions of the posterolateral tibial plateau, posteromedial tibial plateau and peripheral corner of the medial femoral condyle. 4. Large joint effusion.  PATIENT SURVEYS:  06/07/2023 FOTO visit 12  55%  Eval:  FOTO intake:  43%  predicted:  69%  COGNITION: Overall cognitive status: WFL    SENSATION: WFL  EDEMA:  Left knee & calf edematous.    PALPATION: Tenderness to touch along entire joint line, patella tendon and quadriceps tendon.  LOWER EXTREMITY ROM:   ROM Left eval Left 05/06/23 Left/Right Active 05/14/2023 Left 05/19/23 Left 05/24/23 Left 06/03/23 Left 06/07/23  Hip flexion         Hip extension         Hip abduction         Hip adduction         Hip internal rotation         Hip external rotation         Knee flexion P: 72* P: 85 78/142 86 AROM heelslide sitting AA: 90  A:95 P:99   Knee extension P: -6*  0/0    Seated A: LAQ -6*  Ankle dorsiflexion         Ankle plantarflexion         Ankle inversion         Ankle eversion          (Blank rows = not tested)  LOWER EXTREMITY MMT:  MMT Right eval Left eval Left 06/02/22  Hip flexion     Hip extension     Hip abduction     Hip adduction     Hip internal rotation     Hip external rotation     Knee flexion  3-/5 4  Knee extension  3-/5 4  Ankle dorsiflexion     Ankle plantarflexion     Ankle inversion     Ankle eversion      (Blank rows = not tested)  FUNCTIONAL TESTS:  18 inch chair transfer: Modified independent utilizing bilateral upper extremities with no weight on left lower extremity.   GAIT: Distance walked: 100' Assistive device utilized: Crutches Bledsoe brace locked in extension Level of assistance: SBA / verbal cues Comments: Nonweightbearing left lower extremity   TODAY'S TREATMENT                                                                           DATE:  06/07/2023  Therapeutic Exercise: Precor Recumbent Bike seat 10 partial revolutions 2 min and full revolutions forward 6 min Tandem stance on foam beam 30 sec ea with LLE in front & in back. Tandem gait on foam beam including transitioning floor to foam 2 laps.   BFR training cuff  size 3 with LOP 222 mmHG in standing, exercised at 80% for following LLE Standing Terminal Knee Ext with green theraband with step up 6 box tapping RLE to target with light to intermittent UE support with BFR 69,84,84,84 with 30 sec rest bw sets  Gait Training: Pt amb 70' holding crutches up so not weight bearing with no brace safely. Pt amb 150' without device and no brace safely with no knee instability with verbal cues to flex knee for swing. Pt amb in //bars without UE support 3 laps, then forward & backward 2 laps, then side stepping 2 laps;   light UE support braiding 3 laps. Stairs descending step-to using LLE and ascending alternating pattern with right ascending rail (similar to his home) with supervision / verbal cues. No knee instability noted.  Pt neg ramp & curb without device safely with no knee instability.   Modalities Vaso x 10 min to Lt knee; medium pressure 34 deg with elevation.    TREATMENT                                                                          DATE: 06/04/2023 Recumbent bike Seat 10 for 8 minutes, AAROM to full revolutions Seated straight leg raises 2 sets of 5 Knee flexion machine 20# 10 x pull back with both, slowly back with left only  Functional Activities: Double leg press (sit to stand with slow eccentrics) 30/15/15/15 reps at 178 mm HG with Cuff 3, 50# for 30 reps and 56# for all 3 sets of 15 reps   06/03/2023 Therapeutic Exercise: Precor Recumbent Bike seat 10 partial revolutions 4 min then moved seat back to #11 and able to perform full revolutions  3 minutes LLE Step up forward concentric & backwards eccentric  6 box BUE  support //bars 15 reps Lateral step ups 6 inch step with bilat UE support in bars X 10 Walking with one crutch and no brace 3 round trips in bars Sidestepping in bars without UE support 3 round trips Seated SLR 2X15 Leg press DL 49# stretching into max flexion and holding 5 sec each rep X 10, then Left leg only 25# 2X10  Manual therapy for Left knee PROM knee flexion with overpressure mobs  -Vasopnuematic device X 10 min, medium compression, 34 deg to Lt knee     PATIENT EDUCATION:  Education details: HEP, POC Person educated: Patient Education method: Programmer, Multimedia, Facilities Manager, Verbal cues, and Handouts Education comprehension: verbalized understanding, returned demonstration, and verbal cues required  HOME EXERCISE PROGRAM: Access Code: [email or web address] URL: [email or web address] Date: 05/31/2023 Prepared by: Grayce Spatz  Exercises - Standing Quad Set  - 3-4 x daily - 7 x weekly - 2-3 sets - 10 reps - 5 seconds hold - Seated Quad Set  - 3-4 x daily - 7 x weekly - 2-3 sets - 10 reps - 5 seconds hold - Ankle Alphabet in Elevation  - 2 x daily - 7 x weekly - 1 sets - >15 min hold - Seated Hamstring Set  - 3 x daily - 7 x weekly - 10 reps - 5 seconds hold - Seated Heel Slide  - 3 x daily - 7 x weekly - 10 reps - 5 seconds  hold - standing knee extension  - 1 x daily - 7 x weekly - 4 sets - 30-15 reps - 5 seconds hold   ASSESSMENT: CLINICAL IMPRESSION: Patient's functional quad strength has improved.  He ambulated without device including ramps & curbs with no knee instability but slow, guarded pace. He was able to use left knee on stairs with single rail support.  Patient continues to benefit from skilled PT.   OBJECTIVE IMPAIRMENTS: Abnormal gait, decreased activity tolerance, decreased balance, decreased knowledge of use of DME, decreased mobility, difficulty walking, decreased ROM, decreased strength, increased edema, increased muscle spasms, and pain.   ACTIVITY  LIMITATIONS: carrying, lifting, bending, standing, squatting, stairs, transfers, and locomotion level  PARTICIPATION LIMITATIONS: meal prep, cleaning, laundry, community activity, and occupation  PERSONAL FACTORS: 1-2 comorbidities: see PMH  are also affecting patient's functional outcome.   REHAB POTENTIAL: Good  CLINICAL DECISION MAKING: Stable/uncomplicated  EVALUATION COMPLEXITY: Low   GOALS: Goals reviewed with patient? Yes  SHORT TERM GOALS: (target date for Short term goals 06/02/2022)   1.  Patient will demonstrate independent use of home exercise program to maintain progress from in clinic treatments. Baseline: See objective data Goal status: Met 05/14/2023  2. PROM left knee 0* ext to 100* flexion Baseline: See objective data Goal status: Partially Met 05/14/2023  3. Interim FOTO 50% Baseline: See objective data Goal status:  Ongoing  06/07/2023  LONG TERM GOALS: (target dates for all long term goals  07/08/2022 )   1. Patient will demonstrate/report pain at worst less than or equal to 2/10 to facilitate minimal limitation in daily activity secondary to pain symptoms. Baseline: See objective data Goal status: Ongoing 06/07/2023   2. Patient will demonstrate independent use of home exercise program to facilitate ability to maintain/progress functional gains from skilled physical therapy services. Baseline: See objective data Goal status:Ongoing 06/07/2023   3. Patient will demonstrate FOTO outcome > or = 69 % to indicate reduced disability due to condition. Baseline: See objective data Goal status: Ongoing 06/07/2023   4.  Patient will demonstrate left LE MMT 5/5 throughout to faciltiate usual transfers, stairs, squatting at [facility] for daily life.  Baseline: See objective data Goal status: Ongoing 06/07/2023   5.  Patient ambulates >500' without device and negotiates ramps, curbs and stairs single rail alternating pattern modified independent.  Baseline: See objective  data Goal status: Ongoing 06/07/2023   6.  Left knee AROM 0* - 105* Baseline: See objective data Goal status: Ongoing 06/07/2023   7.  patient demonstrates ability to safely perform work related tasks.  Baseline: See objective data Goal status:   Ongoing  06/07/2023  PLAN:  PT FREQUENCY:  2-3x/week  PT DURATION: 10 weeks  PLANNED INTERVENTIONS: Therapeutic exercises, Therapeutic activity, Neuro Muscular re-education, Balance training, Gait training, Patient/Family education, Joint mobilization, Stair training, DME instructions, Dry Needling, Electrical stimulation, Traction, Cryotherapy, vasopneumatic deviceMoist heat, Taping, Ultrasound, Ionotophoresis 4mg /ml Dexamethasone , and aquatic therapy, Manual therapy.  All included unless contraindicated  PLAN FOR NEXT SESSION:   bike.  Gait training without brace or AD, proprioception/balance,  Functional Quadriceps strengthening, knee flexion AROM.    Grayce Spatz, PT, DPT 06/07/2023, 3:27 PM

## 2023-06-09 ENCOUNTER — Ambulatory Visit (INDEPENDENT_AMBULATORY_CARE_PROVIDER_SITE_OTHER): Payer: BC Managed Care – PPO | Admitting: Physical Therapy

## 2023-06-09 DIAGNOSIS — M25562 Pain in left knee: Secondary | ICD-10-CM | POA: Diagnosis not present

## 2023-06-09 DIAGNOSIS — R600 Localized edema: Secondary | ICD-10-CM

## 2023-06-09 DIAGNOSIS — M6281 Muscle weakness (generalized): Secondary | ICD-10-CM

## 2023-06-09 DIAGNOSIS — M25662 Stiffness of left knee, not elsewhere classified: Secondary | ICD-10-CM

## 2023-06-09 DIAGNOSIS — R2689 Other abnormalities of gait and mobility: Secondary | ICD-10-CM

## 2023-06-09 NOTE — Therapy (Signed)
 OUTPATIENT PHYSICAL THERAPY TREATMENT   Patient Name: Jesse Howe MRN: [id number] DOB:08-13-2005, 18 y.o., male Today's Date: 06/09/2023  END OF SESSION:  PT End of Session - 06/09/23 1403     Visit Number 13    Number of Visits 25    Date for PT Re-Evaluation 07/09/23    Authorization Type BCBS & Medicaid    Progress Note Due on Visit 20    PT Start Time 1145    PT Stop Time 1242    PT Time Calculation (min) 57 min    Activity Tolerance Patient tolerated treatment well;Patient limited by pain    Behavior During Therapy [facility] for tasks assessed/performed               Past Medical History:  Diagnosis Date   Allergy    RHINITIS   Asperger syndrome    sees psychiatry and counseling   Asthma    [employer] 08/16/2020   Developmental disorder    Insomnia    Past Surgical History:  Procedure Laterality Date   ANTERIOR CRUCIATE LIGAMENT REPAIR Right 04/07/2021   Procedure: RIGHT KNEE RECONSTRUCTION ANTERIOR CRUCIATE LIGAMENT (ACL) WITH QUADRICEPS AUTOGRAFT, LATERAL MENISCAL DEBRIDEMENT;  Surgeon: Addie Cordella Hamilton, MD;  Location: [facility];  Service: Orthopedics;  Laterality: Right;   ANTERIOR CRUCIATE LIGAMENT REPAIR Left 04/19/2023   Procedure: LEFT KNEE ARTHROSCOPY ANTERIOR CRUCIATE LIGAMENT (ACL) RECONSTRUCTION WITH QUADRICEP GRAFT;  Surgeon: Addie Cordella Hamilton, MD;  Location: [facility] OR;  Service: Orthopedics;  Laterality: Left;   TONSILLECTOMY AND ADENOIDECTOMY Bilateral March 2011   Patient Active Problem List   Diagnosis Date Noted   Left anterior cruciate ligament tear 04/25/2023   Rupture of anterior cruciate ligament of right knee    Acute lateral meniscus tear of right knee    Complete tear of right ACL, initial encounter 03/18/2021   Acute medial meniscus tear of right knee 03/18/2021   Abnormal finding on EKG 06/19/2019   Need for prophylactic vaccination and inoculation against influenza 08/09/2018   Depression in pediatric patient  08/06/2018   Rhinitis, allergic 02/21/2015   Asthma, moderate persistent 02/21/2015   Encounter for follow-up 02/21/2015   Need for HPV vaccination 02/21/2015   Vaccine counseling 02/21/2015   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 Endorses some knee soreness this morning but thinks it is from how he slept. Has been sleeping without brace without issue.   PERTINENT HISTORY: Left ACL reconstruction with quad autograft 04/19/23, right ACL reconstruction with quad autograft 04/07/21, asthma, 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, requiring support, developmental disorder  PAIN:  NPRS scale:  1/10 Pain location: Left knee inside joint Pain description: Achy Aggravating factors: flexion, too much WB Relieving factors: CBD gummie  PRECAUTIONS: Knee  WEIGHT BEARING RESTRICTIONS: Yes LLE WBAT  FALLS:  Has patient fallen in last 6 months? Only with initial injury  LIVING ENVIRONMENT: Lives with: lives with their family and 2 dogs ~100#  Lives in: House  ranch with basement (his bedroom) but staying upstairs for now. Stairs: Yes: Internal: 14 steps; on right going up and ramp at main entrance Has following equipment at home: Crutches and Bledsoe brace  OCCUPATION: works at [employer] - lift up to 75#, on feet for 5 hours  PLOF:  Independent  PATIENT GOALS:  return to work,  function in community.  Next MD visit:  05/24/2023  OBJECTIVE:  DIAGNOSTIC FINDINGS: MRI 03/29/23: impression - 1. Complete ACL tear. 2. No meniscal tear of the left knee. 3. Osseous contusions of the posterolateral tibial plateau, posteromedial tibial plateau and peripheral corner of the medial femoral condyle. 4. Large joint effusion.  PATIENT SURVEYS:  06/07/2023 FOTO visit 12  55%  Eval:  FOTO intake:  43%  predicted:  69%  COGNITION: Overall cognitive status: WFL    SENSATION: WFL  EDEMA:  Left knee & calf edematous.    PALPATION: Tenderness to touch along entire joint line, patella tendon and quadriceps tendon.  LOWER EXTREMITY ROM:   ROM Left eval Left 05/06/23 Left/Right Active 05/14/2023 Left 05/19/23 Left 05/24/23 Left 06/03/23 Left 06/07/23  Hip flexion         Hip extension         Hip abduction         Hip adduction         Hip internal rotation         Hip external rotation         Knee flexion P: 72* P: 85 78/142 86 AROM heelslide sitting AA: 90  A:95 P:99   Knee extension P: -6*  0/0    Seated A: LAQ -6*  Ankle dorsiflexion         Ankle plantarflexion         Ankle inversion         Ankle eversion          (Blank rows = not tested)  LOWER EXTREMITY MMT:  MMT Right eval Left eval Left 06/02/22  Hip flexion     Hip extension     Hip abduction     Hip adduction     Hip internal rotation     Hip external rotation     Knee flexion  3-/5 4  Knee extension  3-/5 4  Ankle dorsiflexion     Ankle plantarflexion     Ankle inversion     Ankle eversion      (Blank rows = not tested)  FUNCTIONAL TESTS:  18 inch chair transfer: Modified independent utilizing bilateral upper extremities with no weight on left lower extremity.   GAIT: Distance walked: 64ft x2 Assistive device utilized: Crutches without brace then without crutches for second bout of ambulation Level of assistance: SBA / verbal  cues Comments: Nonweightbearing left lower extremity  TODAY'S TREATMENT                                                                          DATE:  06/09/2023  Therapeutic Exercise: Precor Recumbent Bike seat 10 full revolutions 2 min backwards and full revolutions forward 6 min level 1 Tandem stance on foam beam 1x30 sec ea with LLE in front & in  back.  Pt amb in //bars without UE support 2 laps of braiding Y balance w/in //bar: 2 reps with RLE stance and 5 reps with LLE stance with patient using light UE assist intermittently progressing to requiring heavy HHA of //bar with fatigue; touching toe to ground at farthest reach throughout. VC for proper form and for decreased reach due to onset of knee pain  BFR training cuff size 3 with LOP 186 mmHG in standing, exercised at 80% for following LLE Standing Terminal Knee Ext with green theraband with step up 6 box tapping RLE to target with light to intermittent UE support with BFR 69,84,84,84 with 30 sec rest between sets Single leg press LLE 25# with BFR 30/15/15/15 reps 30sec rest between reps  for all sets    TREATMENT                                                                          DATE:  06/07/2023  Therapeutic Exercise: Precor Recumbent Bike seat 10 partial revolutions 2 min and full revolutions forward 6 min Tandem stance on foam beam 1x30 sec ea with LLE in front & in back. Tandem gait on foam beam including transitioning floor to foam 2 laps.   BFR training cuff size 3 with LOP 222 mmHG in standing, exercised at 80% for following LLE Standing Terminal Knee Ext with green theraband with step up 6 box tapping RLE to target with light to intermittent UE support with BFR 69,84,84,84 with 30 sec rest bw sets  Gait Training: Pt amb 70' holding crutches up so not weight bearing with no brace safely. Pt amb 150' without device and no brace safely with no knee instability with verbal cues to flex knee for swing. Pt amb  in //bars without UE support 3 laps, then forward & backward 2 laps, then side stepping 2 laps;   light UE support braiding 3 laps. Stairs descending step-to using LLE and ascending alternating pattern with right ascending rail (similar to his home) with supervision / verbal cues. No knee instability noted.  Pt neg ramp & curb without device safely with no knee instability.   Modalities Vaso x 10 min to Lt knee; medium pressure 34 deg with elevation.    TREATMENT                                                                          DATE: 06/04/2023 Recumbent bike Seat 10 for 8 minutes, AAROM to full revolutions Seated straight leg raises 2 sets of 5 Knee flexion machine 20# 10 x pull back with both, slowly back with left only  Functional Activities: Double leg press (sit to stand with slow eccentrics) 30/15/15/15 reps at 178 mm HG with Cuff 3, 50# for 30 reps and 56# for all 3 sets of 15 reps   06/03/2023 Therapeutic Exercise: Precor Recumbent Bike seat 10 partial revolutions 4 min then moved seat back to #11 and able  to perform full revolutions  3 minutes LLE Step up forward concentric & backwards eccentric  6 box BUE support //bars 15 reps Lateral step ups 6 inch step with bilat UE support in bars X 10 Walking with one crutch and no brace 3 round trips in bars Sidestepping in bars without UE support 3 round trips Seated SLR 2X15 Leg press DL 49# stretching into max flexion and holding 5 sec each rep X 10, then Left leg only 25# 2X10  Manual therapy for Left knee PROM knee flexion with overpressure mobs  -Vasopnuematic device X 10 min, medium compression, 34 deg to Lt knee     PATIENT EDUCATION:  Education details: HEP, POC Person educated: Patient Education method: Programmer, Multimedia, Facilities Manager, Verbal cues, and Handouts Education comprehension: verbalized understanding, returned demonstration, and verbal cues required  HOME EXERCISE PROGRAM: Access Code: [email or web address] URL:  [email or web address] Date: 05/31/2023 Prepared by: Grayce Spatz  Exercises - Standing Quad Set  - 3-4 x daily - 7 x weekly - 2-3 sets - 10 reps - 5 seconds hold - Seated Quad Set  - 3-4 x daily - 7 x weekly - 2-3 sets - 10 reps - 5 seconds hold - Ankle Alphabet in Elevation  - 2 x daily - 7 x weekly - 1 sets - >15 min hold - Seated Hamstring Set  - 3 x daily - 7 x weekly - 10 reps - 5 seconds hold - Seated Heel Slide  - 3 x daily - 7 x weekly - 10 reps - 5 seconds hold - standing knee extension  - 1 x daily - 7 x weekly - 4 sets - 30-15 reps - 5 seconds hold   ASSESSMENT: CLINICAL IMPRESSION: Patient's functional quad strength has improved as he was able to progress to single leg press with BFR donned. Patient had difficulty with Y balance in //bar with the patient putting toe down at the end of each reach as well as increased HHA via //bar for stability. Patient endorsed increased knee pain during activity which required modification to distance he was to tap toe down which resolved the pain. Patient still demonstrates deficits with strength and mobility and will benefit from continued physical therapy.    OBJECTIVE IMPAIRMENTS: Abnormal gait, decreased activity tolerance, decreased balance, decreased knowledge of use of DME, decreased mobility, difficulty walking, decreased ROM, decreased strength, increased edema, increased muscle spasms, and pain.   ACTIVITY LIMITATIONS: carrying, lifting, bending, standing, squatting, stairs, transfers, and locomotion level  PARTICIPATION LIMITATIONS: meal prep, cleaning, laundry, community activity, and occupation  PERSONAL FACTORS: 1-2 comorbidities: see PMH  are also affecting patient's functional outcome.   REHAB POTENTIAL: Good  CLINICAL DECISION MAKING: Stable/uncomplicated  EVALUATION COMPLEXITY: Low   GOALS: Goals reviewed with patient? Yes  SHORT TERM GOALS: (target date for Short term goals 06/02/2022)   1.  Patient  will demonstrate independent use of home exercise program to maintain progress from in clinic treatments. Baseline: See objective data Goal status: Met 05/14/2023  2. PROM left knee 0* ext to 100* flexion Baseline: See objective data Goal status: Partially Met 05/14/2023  3. Interim FOTO 50% Baseline: See objective data Goal status:  MET  06/07/2023  LONG TERM GOALS: (target dates for all long term goals  07/08/2022 )   1. Patient will demonstrate/report pain at worst less than or equal to 2/10 to facilitate minimal limitation in daily activity secondary to pain symptoms. Baseline: See objective data Goal status: Ongoing 06/07/2023   2. Patient will demonstrate  independent use of home exercise program to facilitate ability to maintain/progress functional gains from skilled physical therapy services. Baseline: See objective data Goal status:Ongoing 06/07/2023   3. Patient will demonstrate FOTO outcome > or = 69 % to indicate reduced disability due to condition. Baseline: See objective data Goal status: Ongoing 06/07/2023   4.  Patient will demonstrate left LE MMT 5/5 throughout to faciltiate usual transfers, stairs, squatting at [facility] for daily life.  Baseline: See objective data Goal status: Ongoing 06/07/2023   5.  Patient ambulates >500' without device and negotiates ramps, curbs and stairs single rail alternating pattern modified independent.  Baseline: See objective data Goal status: Ongoing 06/07/2023   6.  Left knee AROM 0* - 105* Baseline: See objective data Goal status: Ongoing 06/07/2023   7.  patient demonstrates ability to safely perform work related tasks.  Baseline: See objective data Goal status:   Ongoing  06/07/2023  PLAN:  PT FREQUENCY:  2-3x/week  PT DURATION: 10 weeks  PLANNED INTERVENTIONS: Therapeutic exercises, Therapeutic activity, Neuro Muscular re-education, Balance training, Gait training, Patient/Family education, Joint mobilization, Stair training, DME  instructions, Dry Needling, Electrical stimulation, Traction, Cryotherapy, vasopneumatic deviceMoist heat, Taping, Ultrasound, Ionotophoresis 4mg /ml Dexamethasone , and aquatic therapy, Manual therapy.  All included unless contraindicated  PLAN FOR NEXT SESSION:   Check ROM,  bike.  Gait training without brace or AD, proprioception/balance, BFR  Functional Quadriceps strengthening, knee flexion AROM.    Kaziah Krizek, Morna Flud, Student-PT, DPT 06/09/2023, 2:33 PM   During this evaluation / treatment session, the Physical Therapist/Physical Therapist Assistant was present and participating in the session. This entire session was performed under direct supervision and direction of a licensed therapist/therapist assistant . I have personally read, edited and approve of the note as written.   Robin Waldron, PT, DPT 06/09/2023, 2:45 PM

## 2023-06-11 ENCOUNTER — Encounter: Payer: BC Managed Care – PPO | Admitting: Rehabilitative and Restorative Service Providers"

## 2023-06-15 ENCOUNTER — Ambulatory Visit (INDEPENDENT_AMBULATORY_CARE_PROVIDER_SITE_OTHER): Payer: BC Managed Care – PPO | Admitting: Physical Therapy

## 2023-06-15 DIAGNOSIS — R600 Localized edema: Secondary | ICD-10-CM

## 2023-06-15 DIAGNOSIS — M25562 Pain in left knee: Secondary | ICD-10-CM | POA: Diagnosis not present

## 2023-06-15 DIAGNOSIS — R2689 Other abnormalities of gait and mobility: Secondary | ICD-10-CM

## 2023-06-15 DIAGNOSIS — M25662 Stiffness of left knee, not elsewhere classified: Secondary | ICD-10-CM | POA: Diagnosis not present

## 2023-06-15 DIAGNOSIS — M6281 Muscle weakness (generalized): Secondary | ICD-10-CM

## 2023-06-15 NOTE — Therapy (Addendum)
 OUTPATIENT PHYSICAL THERAPY TREATMENT and PROGRESS NOTE   Patient Name: Jesse Howe MRN: [id number] DOB:05-Dec-2005, 18 y.o., male Today's Date: 06/15/2023  END OF SESSION:  PT End of Session - 06/15/23 1343     Visit Number 14    Number of Visits 25    Date for PT Re-Evaluation 07/09/23    Authorization Type BCBS & Medicaid    Progress Note Due on Visit 24    PT Start Time 1341    PT Stop Time 1433    PT Time Calculation (min) 52 min    Activity Tolerance Patient tolerated treatment well    Behavior During Therapy WFL for tasks assessed/performed                Past Medical History:  Diagnosis Date   Allergy    RHINITIS   Asperger syndrome    sees psychiatry and counseling   Asthma    [employer] 08/16/2020   Developmental disorder    Insomnia    Past Surgical History:  Procedure Laterality Date   ANTERIOR CRUCIATE LIGAMENT REPAIR Right 04/07/2021   Procedure: RIGHT KNEE RECONSTRUCTION ANTERIOR CRUCIATE LIGAMENT (ACL) WITH QUADRICEPS AUTOGRAFT, LATERAL MENISCAL DEBRIDEMENT;  Surgeon: Addie Cordella Hamilton, MD;  Location: [facility];  Service: Orthopedics;  Laterality: Right;   ANTERIOR CRUCIATE LIGAMENT REPAIR Left 04/19/2023   Procedure: LEFT KNEE ARTHROSCOPY ANTERIOR CRUCIATE LIGAMENT (ACL) RECONSTRUCTION WITH QUADRICEP GRAFT;  Surgeon: Addie Cordella Hamilton, MD;  Location: [facility];  Service: Orthopedics;  Laterality: Left;   TONSILLECTOMY AND ADENOIDECTOMY Bilateral March 2011   Patient Active Problem List   Diagnosis Date Noted   Left anterior cruciate ligament tear 04/25/2023   Rupture of anterior cruciate ligament of right knee    Acute lateral meniscus tear of right knee    Complete tear of right ACL, initial encounter 03/18/2021   Acute medial meniscus tear of right knee 03/18/2021   Abnormal finding on EKG 06/19/2019   Need for prophylactic vaccination and inoculation against influenza 08/09/2018   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    Rhinitis, allergic 02/21/2015   Asthma, moderate persistent 02/21/2015   Encounter for follow-up 02/21/2015   Need for HPV vaccination 02/21/2015   Vaccine counseling 02/21/2015   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 Ambulating with antalgic gait. Endorses pain attributing it to walking more without the crutches. Has been sleeping well without issue or interruptions.   PERTINENT HISTORY: Left ACL reconstruction with quad autograft 04/19/23, right ACL reconstruction with quad autograft 04/07/21, asthma, 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, requiring support, developmental disorder  PAIN:  NPRS scale:  3/10, 0/10 at best, 4/10 at worst with sharp pain that resolves at rest Pain location: Left knee inside joint Pain description: Achy Aggravating factors: flexion, too much WB Relieving factors: CBD gummie  PRECAUTIONS: Knee  WEIGHT BEARING RESTRICTIONS: Yes LLE WBAT  FALLS:  Has patient fallen in last 6 months? Only with initial injury  LIVING ENVIRONMENT: Lives with: lives with their family and 2 dogs ~100#   Lives in: House  ranch with basement (his bedroom) but staying upstairs for now. Stairs: Yes: Internal: 14 steps; on right going up and ramp at main entrance Has following equipment at home: Crutches and Bledsoe brace  OCCUPATION:  works at [employer] - lift up to 75#, on feet for 5 hours  PLOF: Independent  PATIENT GOALS:  return to work,  function in community.  Next MD visit:  05/24/2023  OBJECTIVE:  DIAGNOSTIC FINDINGS: MRI 03/29/23: impression - 1. Complete ACL tear. 2. No meniscal tear of the left knee. 3. Osseous contusions of the posterolateral tibial plateau, posteromedial tibial plateau and peripheral corner of the medial femoral condyle. 4. Large joint effusion.  PATIENT SURVEYS:  06/07/2023 FOTO visit 12  55%  Eval:  FOTO intake:  43%  predicted:  69%  COGNITION: Overall cognitive status: WFL    SENSATION: WFL  EDEMA:  Left knee & calf edematous.    PALPATION: Tenderness to touch along entire joint line, patella tendon and quadriceps tendon.  LOWER EXTREMITY ROM:   ROM Left eval Left 05/06/23 Left/Right Active 05/14/2023 Left 05/19/23 Left 05/24/23 Left 06/03/23 Left 06/07/23 Left 06/15/23  Hip flexion          Hip extension          Hip abduction          Hip adduction          Hip internal rotation          Hip external rotation          Knee flexion P: 72* P: 85 78/142 86 AROM heelslide sitting AA: 90  A:95 P:99  Standing //bar P: 113  Supine: A: 102 P:110  Knee extension P: -6*  0/0    Seated A: LAQ -6* Supine:A: 0  Ankle dorsiflexion          Ankle plantarflexion          Ankle inversion          Ankle eversion           (Blank rows = not tested)  LOWER EXTREMITY MMT:  MMT Right eval Left eval Left 06/02/22  Hip flexion     Hip extension     Hip abduction     Hip adduction     Hip internal rotation     Hip external rotation     Knee flexion  3-/5 4  Knee extension  3-/5 4  Ankle dorsiflexion     Ankle plantarflexion     Ankle inversion     Ankle eversion      (Blank rows = not tested)  FUNCTIONAL TESTS:  18 inch chair transfer: Modified independent utilizing bilateral upper extremities with no weight on left lower extremity.    GAIT: 06/15/2023: -pt able to neg ramp & curb without AD and no knee instability noted.   Eval:  Distance walked: 37ft x2 Assistive device utilized: Crutches without brace then without crutches for second bout of ambulation Level of assistance: SBA / verbal cues Comments: Nonweightbearing left lower extremity  TODAY'S TREATMENT  DATE:   06/15/2023 Therapeutic Exercise -Precor Recumbent Bike seat 10 full revolutions 2 min backwards and full revolutions forward 6 min level 1 - Knee flexion on 8in step //bar: 10x10sec  -3way slider sets (flexion/abduction/diagonal bkwds) to cones  5x ea leg  -BFR training cuff size 3 with LOP 186 mmHG in standing, exercised at 80% for following - LLE Standing Terminal Knee Ext with green theraband with step up 8 box tapping RLE to target with hands hovering to intermittent UE support with BFR 69,84,84,84 with 30 sec rest between sets  Gait: -backwards walking 84ft; vc for reaching end range extension with heel meeting floor -pt able to neg ramp & curb without AD and no knee instability noted.  -march walking 178ft; vc for exaggerated knee flexion -hurdle stepping 4x41ft; cues for increased knee flexion and dorsiflexion -side stepping 4x 53ft; vc for maintaining knee extension throughout  Vaso: to Lt knee; med pressure, 34deg with elevation    TREATMENT                                                                          DATE:  06/09/2023  Therapeutic Exercise: Precor Recumbent Bike seat 10 full revolutions 2 min backwards and full revolutions forward 6 min level 1 Tandem stance on foam beam 1x30 sec ea with LLE in front & in back.  Pt amb in //bars without UE support 2 laps of braiding Y balance w/in //bar: 2 reps with RLE stance and 5 reps with LLE stance with patient using light UE assist intermittently progressing to requiring heavy HHA of //bar with fatigue;  touching toe to ground at farthest reach throughout. VC for proper form and for decreased reach due to onset of knee pain  BFR training cuff size 3 with LOP 186 mmHG in standing, exercised at 80% for following LLE Standing Terminal Knee Ext with green theraband with step up 6 box tapping RLE to target with light to intermittent UE support with BFR 69,84,84,84 with 30 sec rest between sets Single leg press LLE 25# with BFR 30/15/15/15 reps 30sec rest between reps  for all sets    TREATMENT                                                                          DATE:  06/07/2023  Therapeutic Exercise: Precor Recumbent Bike seat 10 partial revolutions 2 min and full revolutions forward 6 min Tandem stance on foam beam 1x30 sec ea with LLE in front & in back. Tandem gait on foam beam including transitioning floor to foam 2 laps.   BFR training cuff size 3 with LOP 222 mmHG in standing, exercised at 80% for following LLE Standing Terminal Knee Ext with green theraband with step up 6 box tapping RLE to target with light to intermittent UE support with BFR 69,84,84,84 with 30 sec rest bw sets  Gait Training: Pt amb 70' holding crutches up so  not weight bearing with no brace safely. Pt amb 150' without device and no brace safely with no knee instability with verbal cues to flex knee for swing. Pt amb in //bars without UE support 3 laps, then forward & backward 2 laps, then side stepping 2 laps;   light UE support braiding 3 laps. Stairs descending step-to using LLE and ascending alternating pattern with right ascending rail (similar to his home) with supervision / verbal cues. No knee instability noted.  Pt neg ramp & curb without device safely with no knee instability.   Modalities Vaso x 10 min to Lt knee; medium pressure 34 deg with elevation.  PATIENT EDUCATION:  Education details: HEP, POC Person educated: Patient Education method: Programmer, Multimedia, Demonstration, Verbal cues, and  Handouts Education comprehension: verbalized understanding, returned demonstration, and verbal cues required  HOME EXERCISE PROGRAM: Access Code: [email or web address] URL: [email or web address] Date: 05/31/2023 Prepared by: Grayce Spatz  Exercises - Standing Quad Set  - 3-4 x daily - 7 x weekly - 2-3 sets - 10 reps - 5 seconds hold - Seated Quad Set  - 3-4 x daily - 7 x weekly - 2-3 sets - 10 reps - 5 seconds hold - Ankle Alphabet in Elevation  - 2 x daily - 7 x weekly - 1 sets - >15 min hold - Seated Hamstring Set  - 3 x daily - 7 x weekly - 10 reps - 5 seconds hold - Seated Heel Slide  - 3 x daily - 7 x weekly - 10 reps - 5 seconds hold - standing knee extension  - 1 x daily - 7 x weekly - 4 sets - 30-15 reps - 5 seconds hold   ASSESSMENT: CLINICAL IMPRESSION: Patient continues to make progress towards goals. Patient tolerated increased exercises targeting gait and strength. Patient continues to demonstrate quad fatigue and weakness that was evident during 3way slider sets and as seen during gait. To target gait deviations, active knee extension as well as increased marching were done for increased carryover. Patient demonstrated good carryover and safety with ramp and curb negotiation however, will benefit from continued skilled physical therapy to address strength and ROM deficits as well as deviations for safe return to work and PLOF  OBJECTIVE IMPAIRMENTS: Abnormal gait, decreased activity tolerance, decreased balance, decreased knowledge of use of DME, decreased mobility, difficulty walking, decreased ROM, decreased strength, increased edema, increased muscle spasms, and pain.   ACTIVITY LIMITATIONS: carrying, lifting, bending, standing, squatting, stairs, transfers, and locomotion level  PARTICIPATION LIMITATIONS: meal prep, cleaning, laundry, community activity, and occupation  PERSONAL FACTORS: 1-2 comorbidities: see PMH  are also affecting patient's functional outcome.    REHAB POTENTIAL: Good  CLINICAL DECISION MAKING: Stable/uncomplicated  EVALUATION COMPLEXITY: Low   GOALS: Goals reviewed with patient? Yes  SHORT TERM GOALS: (target date for Short term goals 06/02/2022)   1.  Patient will demonstrate independent use of home exercise program to maintain progress from in clinic treatments. Baseline: See objective data Goal status: Met 05/14/2023  2. PROM left knee 0* ext to 100* flexion Baseline: See objective data Goal status: Partially Met 05/14/2023  3. Interim FOTO 50% Baseline: See objective data Goal status:  MET  06/07/2023  LONG TERM GOALS: (target dates for all long term goals  07/08/2022 )   1. Patient will demonstrate/report pain at worst less than or equal to 2/10 to facilitate minimal limitation in daily activity secondary to pain symptoms. Baseline: See objective data Goal status: Ongoing 06/07/2023   2. Patient will  demonstrate independent use of home exercise program to facilitate ability to maintain/progress functional gains from skilled physical therapy services. Baseline: See objective data Goal status:Ongoing 06/07/2023   3. Patient will demonstrate FOTO outcome > or = 69 % to indicate reduced disability due to condition. Baseline: See objective data Goal status: Ongoing 06/07/2023   4.  Patient will demonstrate left LE MMT 5/5 throughout to faciltiate usual transfers, stairs, squatting at [facility] for daily life.  Baseline: See objective data Goal status: Ongoing 06/07/2023   5.  Patient ambulates >500' without device and negotiates ramps, curbs and stairs single rail alternating pattern modified independent.  Baseline: See objective data Goal status: MET 06/15/2023   6.  Left knee AROM 0* - 105* Baseline: See objective data Goal status: Ongoing 06/07/2023   7.  patient demonstrates ability to safely perform work related tasks.  Baseline: See objective data Goal status:   Ongoing  06/07/2023  PLAN:  PT FREQUENCY:   2-3x/week  PT DURATION: 10 weeks  PLANNED INTERVENTIONS: Therapeutic exercises, Therapeutic activity, Neuro Muscular re-education, Balance training, Gait training, Patient/Family education, Joint mobilization, Stair training, DME instructions, Dry Needling, Electrical stimulation, Traction, Cryotherapy, vasopneumatic deviceMoist heat, Taping, Ultrasound, Ionotophoresis 4mg /ml Dexamethasone , and aquatic therapy, Manual therapy.  All included unless contraindicated  PLAN FOR NEXT SESSION:   Check MD note,  Continue 3way slider sets, gait mechanics via bwds walk/side step/marching, blazepods Y balance and assembled in an arc for balance and SL stance tolerance.    Afreen Siebels, Akito Boomhower, Student-PT 06/15/2023, 2:53 PM  During this evaluation / treatment session, the Physical Therapist/Physical Therapist Assistant was present and participating in the session. This entire session was performed under direct supervision and direction of a licensed therapist/therapist assistant . I have personally read, edited and approve of the note as written.   Robin Waldron, PT, DPT 06/15/2023, 3:30 PM

## 2023-06-16 ENCOUNTER — Ambulatory Visit (INDEPENDENT_AMBULATORY_CARE_PROVIDER_SITE_OTHER): Payer: BC Managed Care – PPO | Admitting: Orthopedic Surgery

## 2023-06-16 ENCOUNTER — Encounter: Payer: Self-pay | Admitting: Orthopedic Surgery

## 2023-06-16 ENCOUNTER — Ambulatory Visit (INDEPENDENT_AMBULATORY_CARE_PROVIDER_SITE_OTHER): Payer: BC Managed Care – PPO | Admitting: Rehabilitative and Restorative Service Providers"

## 2023-06-16 ENCOUNTER — Encounter: Payer: Self-pay | Admitting: Rehabilitative and Restorative Service Providers"

## 2023-06-16 DIAGNOSIS — M25662 Stiffness of left knee, not elsewhere classified: Secondary | ICD-10-CM | POA: Diagnosis not present

## 2023-06-16 DIAGNOSIS — R2689 Other abnormalities of gait and mobility: Secondary | ICD-10-CM

## 2023-06-16 DIAGNOSIS — M25562 Pain in left knee: Secondary | ICD-10-CM | POA: Diagnosis not present

## 2023-06-16 DIAGNOSIS — Z9889 Other specified postprocedural states: Secondary | ICD-10-CM

## 2023-06-16 DIAGNOSIS — M6281 Muscle weakness (generalized): Secondary | ICD-10-CM | POA: Diagnosis not present

## 2023-06-16 DIAGNOSIS — R600 Localized edema: Secondary | ICD-10-CM

## 2023-06-16 MED ORDER — IBUPROFEN 800 MG PO TABS: 800.0000 mg | ORAL_TABLET | Freq: Three times a day (TID) | ORAL | 0 refills | Status: DC | PRN

## 2023-06-16 NOTE — Therapy (Signed)
 OUTPATIENT PHYSICAL THERAPY TREATMENT NOTE   Patient Name: Krithik E Waren MRN: [id number] DOB:[birth year], 18 y.o., male Today's Date: 06/16/2023  END OF SESSION:  PT End of Session - 06/16/23 1518     Visit Number 15    Number of Visits 25    Date for PT Re-Evaluation 07/09/23    Authorization Type BCBS & Medicaid    Progress Note Due on Visit 24    PT Start Time 1515    PT Stop Time 1600    PT Time Calculation (min) 45 min    Activity Tolerance Patient tolerated treatment well;No increased pain    Behavior During Therapy [facility] [place] for tasks assessed/performed              Past Medical History:  Diagnosis Date   Allergy    RHINITIS   Asperger syndrome    sees psychiatry and counseling   Asthma    Phreesia 08/16/2020   Developmental disorder    Insomnia    Past Surgical History:  Procedure Laterality Date   ANTERIOR CRUCIATE LIGAMENT REPAIR Right 04/07/2021   Procedure: RIGHT KNEE RECONSTRUCTION ANTERIOR CRUCIATE LIGAMENT (ACL) WITH QUADRICEPS AUTOGRAFT, LATERAL MENISCAL DEBRIDEMENT;  Surgeon: Jasmine Mesi, MD;  Location: [facility];  Service: Orthopedics;  Laterality: Right;   ANTERIOR CRUCIATE LIGAMENT REPAIR Left 04/19/2023   Procedure: LEFT KNEE ARTHROSCOPY ANTERIOR CRUCIATE LIGAMENT (ACL) RECONSTRUCTION WITH QUADRICEP GRAFT;  Surgeon: Jasmine Mesi, MD;  Location: [facility];  Service: Orthopedics;  Laterality: Left;   TONSILLECTOMY AND ADENOIDECTOMY Bilateral March 2011   Patient Active Problem List   Diagnosis Date Noted   Left anterior cruciate ligament tear 04/25/2023   Rupture of anterior cruciate ligament of right knee    Acute lateral meniscus tear of right knee    Complete tear of right ACL, initial encounter 03/18/2021   Acute medial meniscus tear of right knee 03/18/2021   Abnormal finding on EKG 06/19/2019   Need for prophylactic vaccination and inoculation against influenza 08/09/2018   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    Rhinitis, allergic 02/21/2015   Asthma, moderate persistent 02/21/2015   Encounter for follow-up 02/21/2015   Need for HPV vaccination 02/21/2015   Vaccine counseling 02/21/2015   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  Emphasis on thigh strength and balance.  PERTINENT HISTORY: Left ACL reconstruction with quad autograft 04/19/23, right ACL reconstruction with quad autograft 04/07/21, asthma, 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, requiring support, developmental disorder  PAIN:  NPRS scale: 0-3/10 this week Pain location: Left knee inside joint Pain description: Achy mid to late day, gets better with rest Aggravating factors: Too much WB Relieving factors: CBD gummie  PRECAUTIONS: Knee  WEIGHT BEARING RESTRICTIONS: Yes LLE WBAT  FALLS:  Has patient fallen in last 6 months? Only with initial injury  LIVING ENVIRONMENT: Lives with: lives with their family and 2 dogs ~100#   Lives in: [address]  [address] with  basement (his bedroom) but staying upstairs for now. Stairs: Yes: Internal: 14 steps; on right going up and ramp at main entrance Has following equipment at home: Crutches and Bledsoe brace  OCCUPATION: works at [employer] - lift up to 75#, on feet for 5 hours  PLOF: Independent  PATIENT GOALS:  return to work,   function in community.  Next MD visit:  05/24/2023  OBJECTIVE:  DIAGNOSTIC FINDINGS: MRI 03/29/23: impression - 1. Complete ACL tear. 2. No meniscal tear of the left knee. 3. Osseous contusions of the posterolateral tibial plateau, posteromedial tibial plateau and peripheral corner of the medial femoral condyle. 4. Large joint effusion.  PATIENT SURVEYS:  06/07/2023 FOTO visit 12  55%  Eval:  FOTO intake:  43%  predicted:  69%  COGNITION: Overall cognitive status: WFL    SENSATION: WFL  EDEMA:  Left knee & calf edematous.    PALPATION: Tenderness to touch along entire joint line, patella tendon and quadriceps tendon.  LOWER EXTREMITY ROM:   ROM Left eval Left 05/06/23 Left/Right Active 05/14/2023 Left 05/19/23 Left 05/24/23 Left 06/03/23 Left 06/07/23 Left 06/15/23  Hip flexion          Hip extension          Hip abduction          Hip adduction          Hip internal rotation          Hip external rotation          Knee flexion P: 72* P: 85 78/142 86 AROM heelslide sitting AA: 90  A:95 P:99  Standing //bar P: 113  Supine: A: 102 P:110  Knee extension P: -6*  0/0    Seated A: LAQ -6* Supine:A: 0  Ankle dorsiflexion          Ankle plantarflexion          Ankle inversion          Ankle eversion           (Blank rows = not tested)  LOWER EXTREMITY MMT:  MMT Right eval Left eval Left 06/02/22  Hip flexion     Hip extension     Hip abduction     Hip adduction     Hip internal rotation     Hip external rotation     Knee flexion  3-/5 4  Knee extension  3-/5 4  Ankle dorsiflexion     Ankle plantarflexion     Ankle inversion     Ankle eversion      (Blank rows = not tested)  FUNCTIONAL TESTS:  18 inch chair transfer: Modified independent utilizing bilateral upper extremities with no weight on left lower extremity.   GAIT: 06/15/2023: -pt able to neg ramp & curb without AD and no knee instability noted.   Eval:  Distance walked: 77ft x2 Assistive  device utilized: Crutches without brace then without crutches for second bout of ambulation Level of assistance: SBA / verbal cues Comments: Nonweightbearing left lower extremity  TODAY'S TREATMENT                                                                          DATE:  06/16/2023 Recumbent bike Seat 10 for 7 minutes Level 8 Ball squats with mini-bounce (hips above knees, do not let knees go past toes) 10 x 5 mini-bounces Bridging with Hamstrings curls with heels on Swiss Ball 15 x 3 seconds  Neuromuscular re-education Tandem forward and backward walking on foam 5 laps Single leg balance on foam while tossing tennis ball for balance 4 x 1 minute Blaze Pod Power Cues 4 Pods fast react stand left, tap right: 4 cycles 44; 45; 43;   Functional Activities (stairs and pre-jumping): Step-down off 6 inch step slow eccentrics and do not drop hip 10 x bilateral Single Leg Press 50# 15 x slow eccentrics (left only)   06/15/2023 Therapeutic Exercise -Precor Recumbent Bike seat 10 full revolutions 2 min backwards and full revolutions forward 6 min level 1 - Knee flexion on 8in step //bar: 10x10sec  -3way slider sets (flexion/abduction/diagonal bkwds) to cones  5x ea leg  -BFR training cuff size 3 with LOP 186 mmHG in standing, exercised at 80% for following - LLE Standing Terminal Knee Ext with green theraband with step up 8" box tapping RLE to target with hands hovering to intermittent UE support with BFR 16,10,96,04 with 30 sec rest between sets  Gait: -backwards walking 39ft; vc for reaching end range extension with heel meeting floor -pt able to neg ramp & curb without AD and no knee instability noted.  -march walking 136ft; vc for exaggerated knee flexion -hurdle stepping 4x20ft; cues for increased knee flexion and dorsiflexion -side stepping 4x 22ft; vc for maintaining knee extension throughout  Vaso: to Lt knee; med pressure, 34deg with elevation   06/09/2023   Therapeutic Exercise: Precor Recumbent Bike seat 10 full revolutions 2 min backwards and full revolutions forward 6 min level 1 Tandem stance on foam beam 1x30 sec ea with LLE in front & in back.  Pt amb in //bars without UE support 2 laps of braiding Y balance w/in //bar: 2 reps with RLE stance and 5 reps with LLE stance with patient using light UE assist intermittently progressing to requiring heavy HHA of //bar with fatigue; touching toe to ground at farthest reach throughout. VC for proper form and for decreased reach due to onset of knee pain  BFR training cuff size 3 with LOP 186 mmHG in standing, exercised at 80% for following LLE Standing Terminal Knee Ext with green theraband with step up 6" box tapping RLE to target with light to intermittent UE support with BFR 54,09,81,19 with 30 sec rest between sets Single leg press LLE 25# with BFR 30/15/15/15 reps 30sec rest between reps  for all sets    PATIENT EDUCATION:  Education details: HEP, POC Person educated: Patient Education method: Programmer, multimedia, Demonstration, Verbal cues, and Handouts Education comprehension: verbalized understanding, returned demonstration, and verbal cues required  HOME EXERCISE PROGRAM: Access Code: [email or web address] URL: [email or web address] Date: 05/31/2023 Prepared by: Lorie Rook  Exercises - Standing Quad Set  - 3-4 x daily - 7 x weekly - 2-3 sets - 10 reps - 5 seconds hold - Seated Quad Set  - 3-4 x daily - 7 x weekly - 2-3 sets - 10 reps - 5 seconds hold - Ankle Alphabet in Elevation  - 2 x daily - 7 x weekly - 1 sets - >15 min hold - Seated Hamstring Set  - 3 x daily - 7 x weekly - 10 reps - 5 seconds hold - Seated Heel Slide  - 3 x daily - 7 x  weekly - 10 reps - 5 seconds hold - standing knee extension  - 1 x daily - 7 x weekly - 4 sets - 30-15 reps - 5 seconds hold   ASSESSMENT: CLINICAL IMPRESSION: Left thigh weakness is noted with therapeutic exercises, functional  activities and proprioceptive drills during today's visit.  Advait reports good home exercise program compliance and he continues to give great effort with his supervised physical therapy.  Continuing to improve left thigh strength (quadriceps and hamstrings), functional drills and dynamic balance work should allow Jibri to meet all long-term goals established at evaluation.  OBJECTIVE IMPAIRMENTS: Abnormal gait, decreased activity tolerance, decreased balance, decreased knowledge of use of DME, decreased mobility, difficulty walking, decreased ROM, decreased strength, increased edema, increased muscle spasms, and pain.   ACTIVITY LIMITATIONS: carrying, lifting, bending, standing, squatting, stairs, transfers, and locomotion level  PARTICIPATION LIMITATIONS: meal prep, cleaning, laundry, community activity, and occupation  PERSONAL FACTORS: 1-2 comorbidities: see PMH  are also affecting patient's functional outcome.   REHAB POTENTIAL: Good  CLINICAL DECISION MAKING: Stable/uncomplicated  EVALUATION COMPLEXITY: Low   GOALS: Goals reviewed with patient? Yes  SHORT TERM GOALS: (target date for Short term goals 06/02/2022)   1.  Patient will demonstrate independent use of home exercise program to maintain progress from in clinic treatments. Baseline: See objective data Goal status: Met 05/14/2023  2. PROM left knee 0* ext to 100* flexion Baseline: See objective data Goal status: Partially Met 05/14/2023  3. Interim FOTO 50% Baseline: See objective data Goal status:  MET  06/07/2023  LONG TERM GOALS: (target dates for all long term goals  07/08/2022 )   1. Patient will demonstrate/report pain at worst less than or equal to 2/10 to facilitate minimal limitation in daily activity secondary to pain symptoms. Baseline: See objective data Goal status: Ongoing 06/16/2023   2. Patient will demonstrate independent use of home exercise program to facilitate ability to maintain/progress functional  gains from skilled physical therapy services. Baseline: See objective data Goal status:Ongoing 06/16/2023   3. Patient will demonstrate FOTO outcome > or = 69 % to indicate reduced disability due to condition. Baseline: See objective data Goal status: Ongoing 06/07/2023   4.  Patient will demonstrate left LE MMT 5/5 throughout to faciltiate usual transfers, stairs, squatting at [facility] for daily life.  Baseline: See objective data Goal status: Ongoing 06/07/2023   5.  Patient ambulates >500' without device and negotiates ramps, curbs and stairs single rail alternating pattern modified independent.  Baseline: See objective data Goal status: MET 06/15/2023   6.  Left knee AROM 0* - 105* Baseline: See objective data Goal status: Ongoing 06/07/2023   7.  patient demonstrates ability to safely perform work related tasks.  Baseline: See objective data Goal status:   Ongoing  06/07/2023  PLAN:  PT FREQUENCY:  2-3x/week  PT DURATION: 10 weeks  PLANNED INTERVENTIONS: Therapeutic exercises, Therapeutic activity, Neuro Muscular re-education, Balance training, Gait training, Patient/Family education, Joint mobilization, Stair training, DME instructions, Dry Needling, Electrical stimulation, Traction, Cryotherapy, vasopneumatic deviceMoist heat, Taping, Ultrasound, Ionotophoresis 4mg /ml Dexamethasone , and aquatic therapy, Manual therapy.  All included unless contraindicated  PLAN FOR NEXT SESSION:   Check MD note,  Continue 3way slider sets, gait mechanics via bwds walk/side step/marching, blazepods Y balance and assembled in an arc for balance and SL stance tolerance.  Continue thigh strength work.   Joli Neas PT, MPT

## 2023-06-16 NOTE — Progress Notes (Signed)
Post-Op Visit Note   Patient: Jesse Howe           Date of Birth: 2005/10/08           MRN: [id number] Visit Date: 06/16/2023 PCP: Ronnald Nian, MD   Assessment & Plan:  Chief Complaint:  Chief Complaint  Patient presents with   Left Knee - Routine Post Op     04/19/2023 left knee ACL reconstruction      Visit Diagnoses:  1. S/P ACL reconstruction     Plan: [name] is a 18 year old patient underwent left knee arthroscopy with ACL reconstruction 04/19/2023.  Here for clinical recheck.  Has been in physical therapy.  On examination he has full extension and flexion to about 115.  Have to stable.  Mild effusion is present.  Like him to take ibuprofen for about 3 weeks and continue physical therapy for close chain quad strengthening and hamstring strengthening exercises.  Please get good quad tone today.  No calf tenderness.  4-week return.  Note provided out of work for 4 more weeks and then he can return to modified duty but we will see him back in 4 weeks to discuss more fully that duty.  I think he would be less climbing of stairs and more just standing and transferring packages.  Follow-Up Instructions: Return in about 4 weeks (around 07/14/2023).   Orders:  No orders of the defined types were placed in this encounter.  Meds ordered this encounter  Medications   ibuprofen (ADVIL) 800 MG tablet    Sig: Take 1 tablet (800 mg total) by mouth every 8 (eight) hours as needed.    Dispense:  30 tablet    Refill:  0    Imaging: No results found.  PMFS History: Patient Active Problem List   Diagnosis Date Noted   Left anterior cruciate ligament tear 04/25/2023   Rupture of anterior cruciate ligament of right knee    Acute lateral meniscus tear of right knee    Complete tear of right ACL, initial encounter 03/18/2021   Acute medial meniscus tear of right knee 03/18/2021   Abnormal finding on EKG 06/19/2019   Need for prophylactic vaccination and inoculation against  influenza 08/09/2018   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   Rhinitis, allergic 02/21/2015   Asthma, moderate persistent 02/21/2015   Encounter for follow-up 02/21/2015   Need for HPV vaccination 02/21/2015   Vaccine counseling 02/21/2015   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, requiring support (level 1) 12/07/2013   Developmental disorder 05/11/2013   Past Medical History:  Diagnosis Date   Allergy    RHINITIS   Asperger syndrome    sees psychiatry and counseling   Asthma    Phreesia 08/16/2020   Developmental disorder    Insomnia     Family History  Problem Relation Age of Onset   Arthritis Paternal Grandmother    Diabetes Paternal Grandmother    Depression Paternal Grandmother    Arthritis Paternal Grandfather    Diabetes Paternal Grandfather    Hypertension Paternal Grandfather    Arthritis Mother        rheumatoid   HIV Maternal Grandfather        Died in his 39's    Past Surgical History:  Procedure Laterality Date   ANTERIOR CRUCIATE LIGAMENT REPAIR Right 04/07/2021   Procedure: RIGHT KNEE RECONSTRUCTION ANTERIOR CRUCIATE LIGAMENT (ACL) WITH QUADRICEPS AUTOGRAFT, LATERAL MENISCAL DEBRIDEMENT;  Surgeon: Cammy Copa, MD;  Location: [facility];  Service: Orthopedics;  Laterality: Right;   ANTERIOR CRUCIATE LIGAMENT REPAIR Left 04/19/2023   Procedure: LEFT KNEE ARTHROSCOPY ANTERIOR CRUCIATE LIGAMENT (ACL) RECONSTRUCTION WITH QUADRICEP GRAFT;  Surgeon: Cammy Copa, MD;  Location: [facility];  Service: Orthopedics;  Laterality: Left;   TONSILLECTOMY AND ADENOIDECTOMY Bilateral March 2011   Social History   Occupational History   Not on file  Tobacco Use   Smoking status: Never   Smokeless tobacco: Never  Substance and Sexual Activity   Alcohol use: No   Drug use: No   Sexual activity: Not on file

## 2023-06-17 ENCOUNTER — Telehealth: Payer: Self-pay

## 2023-06-17 NOTE — Telephone Encounter (Signed)
-----   Message from Burnard Bunting sent at 06/17/2023 11:54 AM EST ----- Please follow-up Jesse Howe in 4 weeks thanks

## 2023-06-17 NOTE — Telephone Encounter (Signed)
Can you please make sure patient has 4 wk follow up appt scheduled

## 2023-06-18 ENCOUNTER — Ambulatory Visit (INDEPENDENT_AMBULATORY_CARE_PROVIDER_SITE_OTHER): Payer: BC Managed Care – PPO | Admitting: Rehabilitative and Restorative Service Providers"

## 2023-06-18 ENCOUNTER — Encounter: Payer: Self-pay | Admitting: Rehabilitative and Restorative Service Providers"

## 2023-06-18 DIAGNOSIS — M6281 Muscle weakness (generalized): Secondary | ICD-10-CM

## 2023-06-18 DIAGNOSIS — R2689 Other abnormalities of gait and mobility: Secondary | ICD-10-CM | POA: Diagnosis not present

## 2023-06-18 DIAGNOSIS — M25662 Stiffness of left knee, not elsewhere classified: Secondary | ICD-10-CM | POA: Diagnosis not present

## 2023-06-18 DIAGNOSIS — M25562 Pain in left knee: Secondary | ICD-10-CM | POA: Diagnosis not present

## 2023-06-18 DIAGNOSIS — R600 Localized edema: Secondary | ICD-10-CM

## 2023-06-18 NOTE — Therapy (Signed)
OUTPATIENT PHYSICAL THERAPY TREATMENT NOTE   Patient Name: Jesse Howe MRN: [id number] DOB:March 13, 2006, 18 y.o., male Today's Date: 06/18/2023  END OF SESSION:  PT End of Session - 06/18/23 1146     Visit Number 16    Number of Visits 25    Date for PT Re-Evaluation 07/09/23    Authorization Type BCBS & Medicaid    Progress Note Due on Visit 24    PT Start Time 1145    PT Stop Time 1230    PT Time Calculation (min) 45 min    Activity Tolerance Patient tolerated treatment well;No increased pain    Behavior During Therapy [facility] for tasks assessed/performed               Past Medical History:  Diagnosis Date   Allergy    RHINITIS   Asperger syndrome    sees psychiatry and counseling   Asthma    [employer] 08/16/2020   Developmental disorder    Insomnia    Past Surgical History:  Procedure Laterality Date   ANTERIOR CRUCIATE LIGAMENT REPAIR Right 04/07/2021   Procedure: RIGHT KNEE RECONSTRUCTION ANTERIOR CRUCIATE LIGAMENT (ACL) WITH QUADRICEPS AUTOGRAFT, LATERAL MENISCAL DEBRIDEMENT;  Surgeon: Cammy Copa, MD;  Location: [facility];  Service: Orthopedics;  Laterality: Right;   ANTERIOR CRUCIATE LIGAMENT REPAIR Left 04/19/2023   Procedure: LEFT KNEE ARTHROSCOPY ANTERIOR CRUCIATE LIGAMENT (ACL) RECONSTRUCTION WITH QUADRICEP GRAFT;  Surgeon: Cammy Copa, MD;  Location: [facility];  Service: Orthopedics;  Laterality: Left;   TONSILLECTOMY AND ADENOIDECTOMY Bilateral March 2011   Patient Active Problem List   Diagnosis Date Noted   Left anterior cruciate ligament tear 04/25/2023   Rupture of anterior cruciate ligament of right knee    Acute lateral meniscus tear of right knee    Complete tear of right ACL, initial encounter 03/18/2021   Acute medial meniscus tear of right knee 03/18/2021   Abnormal finding on EKG 06/19/2019   Need for prophylactic vaccination and inoculation against influenza 08/09/2018   Depression in pediatric patient  08/06/2018   Rhinitis, allergic 02/21/2015   Asthma, moderate persistent 02/21/2015   Encounter for follow-up 02/21/2015   Need for HPV vaccination 02/21/2015   Vaccine counseling 02/21/2015   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  Emphasis remains thigh strength and balance.  PERTINENT HISTORY: Left ACL reconstruction with quad autograft 04/19/23, right ACL reconstruction with quad autograft 04/07/21, asthma, 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, requiring support, developmental disorder  PAIN:  NPRS scale: 0-3/10 this week Pain location: Left knee inside joint Pain description: Achy mid to late day, gets better with rest Aggravating factors: Too much WB Relieving factors: CBD gummie  PRECAUTIONS: Knee  WEIGHT BEARING RESTRICTIONS: Yes LLE WBAT  FALLS:  Has patient fallen in last 6 months? Only with initial injury  LIVING ENVIRONMENT: Lives with: lives with their family and 2 dogs ~100#   [place] in: [address]  [place]  with basement (his bedroom) but staying upstairs for now. Stairs: Yes: Internal: 14 steps; on right going up and ramp at main entrance Has following equipment at home: Crutches and Bledsoe brace  OCCUPATION: works at [employer] - lift up to 75#, on feet for 5 hours  PLOF: Independent  PATIENT GOALS:   return to work,  function in community.  Next MD visit:  05/24/2023  OBJECTIVE:  DIAGNOSTIC FINDINGS: MRI 03/29/23: impression - 1. Complete ACL tear. 2. No meniscal tear of the left knee. 3. Osseous contusions of the posterolateral tibial plateau, posteromedial tibial plateau and peripheral corner of the medial femoral condyle. 4. Large joint effusion.  PATIENT SURVEYS:  06/07/2023 FOTO visit 12  55%  Eval:  FOTO intake:  43%  predicted:  69%  COGNITION: Overall cognitive status: WFL    SENSATION: WFL  EDEMA:  Left knee & calf edematous.    PALPATION: Tenderness to touch along entire joint line, patella tendon and quadriceps tendon.  LOWER EXTREMITY ROM:   ROM Left eval Left 05/06/23 Left/Right Active 05/14/2023 Left 05/19/23 Left 05/24/23 Left 06/03/23 Left 06/07/23 Left 06/15/23  Hip flexion          Hip extension          Hip abduction          Hip adduction          Hip internal rotation          Hip external rotation          Knee flexion P: 72* P: 85 78/142 86 AROM heelslide sitting AA: 90  A:95 P:99  Standing //bar P: 113  Supine: A: 102 P:110  Knee extension P: -6*  0/0    Seated A: LAQ -6* Supine:A: 0  Ankle dorsiflexion          Ankle plantarflexion          Ankle inversion          Ankle eversion           (Blank rows = not tested)  LOWER EXTREMITY MMT:  MMT Right eval Left eval Left 06/02/22  Hip flexion     Hip extension     Hip abduction     Hip adduction     Hip internal rotation     Hip external rotation     Knee flexion  3-/5 4  Knee extension  3-/5 4  Ankle dorsiflexion     Ankle plantarflexion     Ankle inversion     Ankle eversion      (Blank rows = not tested)  FUNCTIONAL TESTS:  18 inch chair transfer: Modified independent utilizing bilateral upper extremities with no weight on left lower extremity.   GAIT: 06/15/2023: -pt able to neg ramp & curb without AD and no knee instability noted.   Eval:  Distance walked: 66ft  x2 Assistive device utilized: Crutches without brace then without crutches for second bout of ambulation Level of assistance: SBA / verbal cues Comments: Nonweightbearing left lower extremity  TODAY'S TREATMENT                                                                          DATE:  06/18/2023 Recumbent bike Seat 10 for 5 minutes Level 8 with BFR training cuff size 3 with LOP 180 mmHG in sitting, exercised at 80%  Ball squats with mini-bounce (hips above knees, do not let knees go past toes) 12 x 5 mini-bounces Bridging with Hamstrings curls with heels on Swiss Ball 15 x 3 seconds Seated straight leg raises 3 sets of 5 with 1#  Neuromuscular re-education Dynamic High step Tandem forward and backward walking on foam 2 laps Single leg balance on floor while tossing tennis ball for balance 5 x 30 seconds Y reaching 10 x    06/16/2023 Recumbent bike Seat 10 for 7 minutes Level 8 Ball squats with mini-bounce (hips above knees, do not let knees go past toes) 10 x 5 mini-bounces Bridging with Hamstrings curls with heels on Swiss Ball 15 x 3 seconds  Neuromuscular re-education Tandem forward and backward walking on foam 5 laps Single leg balance on foam while tossing tennis ball for balance 4 x 1 minute Blaze Pod Power Cues 4 Pods fast react stand left, tap right: 4 cycles 44; 45; 43;   Functional Activities (stairs and pre-jumping): Step-down off 6 inch step slow eccentrics and do not drop hip 10 x bilateral Single Leg Press 50# 15 x slow eccentrics (left only)   06/15/2023 Therapeutic Exercise -Precor Recumbent Bike seat 10 full revolutions 2 min backwards and full revolutions forward 6 min level 1 - Knee flexion on 8in step //bar: 10x10sec  -3way slider sets (flexion/abduction/diagonal bkwds) to cones  5x ea leg  -BFR training cuff size 3 with LOP 186 mmHG in standing, exercised at 80% for following - LLE Standing Terminal Knee Ext with green theraband with  step up 8" box tapping RLE to target with hands hovering to intermittent UE support with BFR 16,10,96,04 with 30 sec rest between sets  Gait: -backwards walking 41ft; vc for reaching end range extension with heel meeting floor -pt able to neg ramp & curb without AD and no knee instability noted.  -march walking 136ft; vc for exaggerated knee flexion -hurdle stepping 4x26ft; cues for increased knee flexion and dorsiflexion -side stepping 4x 51ft; vc for maintaining knee extension throughout  Vaso: to Lt knee; med pressure, 34deg with elevation   PATIENT EDUCATION:  Education details: HEP, POC Person educated: Patient Education method: Programmer, multimedia, Demonstration, Verbal cues, and Handouts Education comprehension: verbalized understanding, returned demonstration, and verbal cues required  HOME EXERCISE PROGRAM: Access Code: [email or web address] URL: [email or web address] Date: 05/31/2023 Prepared by: Vladimir Faster  Exercises - Standing Quad Set  - 3-4 x daily - 7 x weekly - 2-3 sets - 10 reps - 5 seconds hold - Seated Quad Set  - 3-4 x daily - 7 x weekly - 2-3 sets - 10 reps - 5 seconds hold - Ankle Alphabet in Elevation  - 2 x daily - 7 x weekly - 1 sets - >15 min hold - Seated Hamstring Set  - 3 x daily - 7 x weekly - 10 reps - 5 seconds hold - Seated Heel Slide  - 3 x daily - 7 x weekly - 10 reps - 5 seconds hold - standing knee extension  - 1 x daily - 7 x weekly - 4 sets - 30-15 reps - 5 seconds hold   ASSESSMENT: CLINICAL IMPRESSION: Left thigh weakness is noted as shaking with fatigue with Tory's therapeutic exercises, functional activities and proprioceptive drills.  Tokuo reports continued home exercise program compliance and he continues to give  great effort with his supervised physical therapy.  Continuing to improve left thigh strength (quadriceps and hamstrings), functional drills and dynamic balance work should allow [place] to meet all long-term goals  established at evaluation.  OBJECTIVE IMPAIRMENTS: Abnormal gait, decreased activity tolerance, decreased balance, decreased knowledge of use of DME, decreased mobility, difficulty walking, decreased ROM, decreased strength, increased edema, increased muscle spasms, and pain.   ACTIVITY LIMITATIONS: carrying, lifting, bending, standing, squatting, stairs, transfers, and locomotion level  PARTICIPATION LIMITATIONS: meal prep, cleaning, laundry, community activity, and occupation  PERSONAL FACTORS: 1-2 comorbidities: see PMH  are also affecting patient's functional outcome.   REHAB POTENTIAL: Good  CLINICAL DECISION MAKING: Stable/uncomplicated  EVALUATION COMPLEXITY: Low   GOALS: Goals reviewed with patient? Yes  SHORT TERM GOALS: (target date for Short term goals 06/02/2022)   1.  Patient will demonstrate independent use of home exercise program to maintain progress from in clinic treatments. Baseline: See objective data Goal status: Met 05/14/2023  2. PROM left knee 0* ext to 100* flexion Baseline: See objective data Goal status: Partially Met 05/14/2023  3. Interim FOTO 50% Baseline: See objective data Goal status:  MET  06/07/2023  LONG TERM GOALS: (target dates for all long term goals  07/08/2022 )   1. Patient will demonstrate/report pain at worst less than or equal to 2/10 to facilitate minimal limitation in daily activity secondary to pain symptoms. Baseline: See objective data Goal status: Ongoing 06/16/2023   2. Patient will demonstrate independent use of home exercise program to facilitate ability to maintain/progress functional gains from skilled physical therapy services. Baseline: See objective data Goal status:Ongoing 06/16/2023   3. Patient will demonstrate FOTO outcome > or = 69 % to indicate reduced disability due to condition. Baseline: See objective data Goal status: Ongoing 06/07/2023   4.  Patient will demonstrate left LE MMT 5/5 throughout to faciltiate  usual transfers, stairs, squatting at [facility] for daily life.  Baseline: See objective data Goal status: Ongoing 06/07/2023   5.  Patient ambulates >500' without device and negotiates ramps, curbs and stairs single rail alternating pattern modified independent.  Baseline: See objective data Goal status: MET 06/15/2023   6.  Left knee AROM 0* - 105* Baseline: See objective data Goal status: Ongoing 06/07/2023   7.  patient demonstrates ability to safely perform work related tasks.  Baseline: See objective data Goal status:   Ongoing  06/07/2023  PLAN:  PT FREQUENCY:  2-3x/week  PT DURATION: 10 weeks  PLANNED INTERVENTIONS: Therapeutic exercises, Therapeutic activity, Neuro Muscular re-education, Balance training, Gait training, Patient/Family education, Joint mobilization, Stair training, DME instructions, Dry Needling, Electrical stimulation, Traction, Cryotherapy, vasopneumatic deviceMoist heat, Taping, Ultrasound, Ionotophoresis 4mg /ml Dexamethasone, and aquatic therapy, Manual therapy.  All included unless contraindicated  PLAN FOR NEXT SESSION:   Check MD note, Continue 3 way slider sets, gait mechanics via bwds walk/side step/marching, blazepods Y balance and assembled in an arc for balance and SL stance tolerance.  Continue thigh strength work.   Cherlyn Cushing PT, MPT

## 2023-06-21 ENCOUNTER — Ambulatory Visit (INDEPENDENT_AMBULATORY_CARE_PROVIDER_SITE_OTHER): Payer: BC Managed Care – PPO | Admitting: Physical Therapy

## 2023-06-21 ENCOUNTER — Encounter: Payer: Self-pay | Admitting: Physical Therapy

## 2023-06-21 DIAGNOSIS — M25562 Pain in left knee: Secondary | ICD-10-CM | POA: Diagnosis not present

## 2023-06-21 DIAGNOSIS — R600 Localized edema: Secondary | ICD-10-CM

## 2023-06-21 DIAGNOSIS — M6281 Muscle weakness (generalized): Secondary | ICD-10-CM | POA: Diagnosis not present

## 2023-06-21 DIAGNOSIS — R2689 Other abnormalities of gait and mobility: Secondary | ICD-10-CM

## 2023-06-21 DIAGNOSIS — M25662 Stiffness of left knee, not elsewhere classified: Secondary | ICD-10-CM | POA: Diagnosis not present

## 2023-06-21 NOTE — Therapy (Signed)
OUTPATIENT PHYSICAL THERAPY TREATMENT NOTE   Patient Name: Jesse Howe MRN: [id number] DOB:[birth year], 18 y.o., male Today's Date: 06/21/2023  END OF SESSION:  PT End of Session - 06/21/23 1435     Visit Number 17    Number of Visits 25    Date for PT Re-Evaluation 07/09/23    Authorization Type BCBS & Medicaid    Progress Note Due on Visit 24    PT Start Time 1430    PT Stop Time 1510    PT Time Calculation (min) 40 min    Activity Tolerance Patient tolerated treatment well;No increased pain    Behavior During Therapy [facility] for tasks assessed/performed               Past Medical History:  Diagnosis Date   Allergy    RHINITIS   Asperger syndrome    sees psychiatry and counseling   Asthma    [employer] 08/16/2020   Developmental disorder    Insomnia    Past Surgical History:  Procedure Laterality Date   ANTERIOR CRUCIATE LIGAMENT REPAIR Right 04/07/2021   Procedure: RIGHT KNEE RECONSTRUCTION ANTERIOR CRUCIATE LIGAMENT (ACL) WITH QUADRICEPS AUTOGRAFT, LATERAL MENISCAL DEBRIDEMENT;  Surgeon: Cammy Copa, MD;  Location: [facility];  Service: Orthopedics;  Laterality: Right;   ANTERIOR CRUCIATE LIGAMENT REPAIR Left 04/19/2023   Procedure: LEFT KNEE ARTHROSCOPY ANTERIOR CRUCIATE LIGAMENT (ACL) RECONSTRUCTION WITH QUADRICEP GRAFT;  Surgeon: Cammy Copa, MD;  Location: [facility];  Service: Orthopedics;  Laterality: Left;   TONSILLECTOMY AND ADENOIDECTOMY Bilateral March 2011   Patient Active Problem List   Diagnosis Date Noted   Left anterior cruciate ligament tear 04/25/2023   Rupture of anterior cruciate ligament of right knee    Acute lateral meniscus tear of right knee    Complete tear of right ACL, initial encounter 03/18/2021   Acute medial meniscus tear of right knee 03/18/2021   Abnormal finding on EKG 06/19/2019   Need for prophylactic vaccination and inoculation against influenza 08/09/2018   Depression in pediatric patient  08/06/2018   Rhinitis, allergic 02/21/2015   Asthma, moderate persistent 02/21/2015   Encounter for follow-up 02/21/2015   Need for HPV vaccination 02/21/2015   Vaccine counseling 02/21/2015   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, requiring support (level 1) 12/07/2013   Developmental disorder 05/11/2013    PCP: Ronnald Nian, MD  REFERRING PROVIDER: Cammy Copa, MD  REFERRING DIAG: [phone] (ICD-10-CM) - S/P ACL reconstruction   THERAPY DIAG:  Muscle weakness (generalized)  Acute pain of left knee  Stiffness of left knee, not elsewhere classified  Other abnormalities of gait and mobility  Localized edema  Rationale for Evaluation and Treatment: Rehabilitation  ONSET DATE: 04/19/2023 Left knee ACL arthroscopy  SUBJECTIVE:   SUBJECTIVE STATEMENT: Jesse Howe reports overall doing good, no pain upon arrival, does have moments of knee instability at times  PERTINENT HISTORY: Left ACL reconstruction with quad autograft 04/19/23, right ACL reconstruction with quad autograft 04/07/21, asthma, 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, requiring support, developmental disorder  PAIN:  NPRS scale: 0/10 upon arrival Pain location: Left knee inside joint Pain description: Achy mid to late day, gets better with rest Aggravating factors: Too much WB Relieving factors: CBD gummie  PRECAUTIONS: Knee  WEIGHT BEARING RESTRICTIONS: Yes LLE WBAT  FALLS:  Has patient fallen in last 6 months? Only with initial injury  LIVING ENVIRONMENT: Lives with: lives with their family and 2 dogs ~100#   Lives in:  House  ranch with basement (his bedroom) but staying upstairs for now. Stairs: Yes: Internal: 14 steps; on right going up and ramp at main entrance Has following equipment at home: Crutches and Bledsoe brace  OCCUPATION: works at [employer] - lift up to 75#, on feet for 5 hours  PLOF: Independent  PATIENT  GOALS:  return to work,  function in community.  Next MD visit:  05/24/2023  OBJECTIVE:  DIAGNOSTIC FINDINGS: MRI 03/29/23: impression - 1. Complete ACL tear. 2. No meniscal tear of the left knee. 3. Osseous contusions of the posterolateral tibial plateau, posteromedial tibial plateau and peripheral corner of the medial femoral condyle. 4. Large joint effusion.  PATIENT SURVEYS:  06/07/2023 FOTO visit 12  55%  Eval:  FOTO intake:  43%  predicted:  69%  COGNITION: Overall cognitive status: WFL    SENSATION: WFL  EDEMA:  Left knee & calf edematous.    PALPATION: Tenderness to touch along entire joint line, patella tendon and quadriceps tendon.  LOWER EXTREMITY ROM:   ROM Left eval Left 05/06/23 Left/Right Active 05/14/2023 Left 05/19/23 Left 05/24/23 Left 06/03/23 Left 06/07/23 Left 06/15/23  Hip flexion          Hip extension          Hip abduction          Hip adduction          Hip internal rotation          Hip external rotation          Knee flexion P: 72* P: 85 78/142 86 AROM heelslide sitting AA: 90  A:95 P:99  Standing //bar P: 113  Supine: A: 102 P:110  Knee extension P: -6*  0/0    Seated A: LAQ -6* Supine:A: 0  Ankle dorsiflexion          Ankle plantarflexion          Ankle inversion          Ankle eversion           (Blank rows = not tested)  LOWER EXTREMITY MMT:  MMT Right eval Left eval Left 06/02/22  Hip flexion     Hip extension     Hip abduction     Hip adduction     Hip internal rotation     Hip external rotation     Knee flexion  3-/5 4  Knee extension  3-/5 4  Ankle dorsiflexion     Ankle plantarflexion     Ankle inversion     Ankle eversion      (Blank rows = not tested)  FUNCTIONAL TESTS:  18 inch chair transfer: Modified independent utilizing bilateral upper extremities with no weight on left lower extremity.   GAIT: 06/15/2023: -pt able to neg ramp & curb without AD and no knee instability noted.   Eval:  Distance walked:  85ft x2 Assistive device utilized: Crutches without brace then without crutches for second bout of ambulation Level of assistance: SBA / verbal cues Comments: Nonweightbearing left lower extremity  TODAY'S TREATMENT  DATE:   06/21/23 Recumbent bike L6 X 8 min Standing quad stretch with strap 20 sec X 5 TRX squats 2X10 Step ups 6 inch step without UE support X 19 (he thought he was doing 2 sets so was past the prescribed 15 reps) forward, X 15 lateral Leg press left leg only 62 #2X10, then DL 161# 0R60 Y reaches  A54 each way Seated SLR 2# 3X10   06/18/2023 Recumbent bike Seat 10 for 5 minutes Level 8 with BFR training cuff size 3 with LOP 180 mmHG in sitting, exercised at 80%  Ball squats with mini-bounce (hips above knees, do not let knees go past toes) 12 x 5 mini-bounces Bridging with Hamstrings curls with heels on Swiss Ball 15 x 3 seconds Seated straight leg raises 3 sets of 5 with 1#  Neuromuscular re-education Dynamic High step Tandem forward and backward walking on foam 2 laps Single leg balance on floor while tossing tennis ball for balance 5 x 30 seconds Y reaching 10 x    06/16/2023 Recumbent bike Seat 10 for 7 minutes Level 8 Ball squats with mini-bounce (hips above knees, do not let knees go past toes) 10 x 5 mini-bounces Bridging with Hamstrings curls with heels on Swiss Ball 15 x 3 seconds  Neuromuscular re-education Tandem forward and backward walking on foam 5 laps Single leg balance on foam while tossing tennis ball for balance 4 x 1 minute Blaze Pod Power Cues 4 Pods fast react stand left, tap right: 4 cycles 44; 45; 43;   Functional Activities (stairs and pre-jumping): Step-down off 6 inch step slow eccentrics and do not drop hip 10 x bilateral Single Leg Press 50# 15 x slow eccentrics (left only)   06/15/2023 Therapeutic Exercise -Precor Recumbent Bike seat 10 full revolutions  2 min backwards and full revolutions forward 6 min level 1 - Knee flexion on 8in step //bar: 10x10sec  -3way slider sets (flexion/abduction/diagonal bkwds) to cones  5x ea leg  -BFR training cuff size 3 with LOP 186 mmHG in standing, exercised at 80% for following - LLE Standing Terminal Knee Ext with green theraband with step up 8" box tapping RLE to target with hands hovering to intermittent UE support with BFR 09,81,19,14 with 30 sec rest between sets  Gait: -backwards walking 12ft; vc for reaching end range extension with heel meeting floor -pt able to neg ramp & curb without AD and no knee instability noted.  -march walking 167ft; vc for exaggerated knee flexion -hurdle stepping 4x75ft; cues for increased knee flexion and dorsiflexion -side stepping 4x 33ft; vc for maintaining knee extension throughout  Vaso: to Lt knee; med pressure, 34deg with elevation   PATIENT EDUCATION:  Education details: HEP, POC Person educated: Patient Education method: Programmer, multimedia, Demonstration, Verbal cues, and Handouts Education comprehension: verbalized understanding, returned demonstration, and verbal cues required  HOME EXERCISE PROGRAM: Access Code: [email or web address] URL: [email or web address] Date: 05/31/2023 Prepared by: Vladimir Faster  Exercises - Standing Quad Set  - 3-4 x daily - 7 x weekly - 2-3 sets - 10 reps - 5 seconds hold - Seated Quad Set  - 3-4 x daily - 7 x weekly - 2-3 sets - 10 reps - 5 seconds hold - Ankle Alphabet in Elevation  - 2 x daily - 7 x weekly - 1 sets - >15 min hold - Seated Hamstring Set  - 3 x daily - 7 x weekly - 10 reps - 5 seconds hold - Seated Heel Slide  -  3 x daily - 7 x weekly - 10 reps - 5 seconds hold - standing knee extension  - 1 x daily - 7 x weekly - 4 sets - 30-15 reps - 5 seconds hold   ASSESSMENT: CLINICAL IMPRESSION: We progressed his strengthening program with emphasis on functional quad strength. Some functional weakness  still noted for squats, stairs, lunges that we will continue to work to address as tolerated.   OBJECTIVE IMPAIRMENTS: Abnormal gait, decreased activity tolerance, decreased balance, decreased knowledge of use of DME, decreased mobility, difficulty walking, decreased ROM, decreased strength, increased edema, increased muscle spasms, and pain.   ACTIVITY LIMITATIONS: carrying, lifting, bending, standing, squatting, stairs, transfers, and locomotion level  PARTICIPATION LIMITATIONS: meal prep, cleaning, laundry, community activity, and occupation  PERSONAL FACTORS: 1-2 comorbidities: see PMH  are also affecting patient's functional outcome.   REHAB POTENTIAL: Good  CLINICAL DECISION MAKING: Stable/uncomplicated  EVALUATION COMPLEXITY: Low   GOALS: Goals reviewed with patient? Yes  SHORT TERM GOALS: (target date for Short term goals 06/02/2022)   1.  Patient will demonstrate independent use of home exercise program to maintain progress from in clinic treatments. Baseline: See objective data Goal status: Met 05/14/2023  2. PROM left knee 0* ext to 100* flexion Baseline: See objective data Goal status: Partially Met 05/14/2023  3. Interim FOTO 50% Baseline: See objective data Goal status:  MET  06/07/2023  LONG TERM GOALS: (target dates for all long term goals  07/08/2022 )   1. Patient will demonstrate/report pain at worst less than or equal to 2/10 to facilitate minimal limitation in daily activity secondary to pain symptoms. Baseline: See objective data Goal status: Ongoing 06/16/2023   2. Patient will demonstrate independent use of home exercise program to facilitate ability to maintain/progress functional gains from skilled physical therapy services. Baseline: See objective data Goal status:Ongoing 06/16/2023   3. Patient will demonstrate FOTO outcome > or = 69 % to indicate reduced disability due to condition. Baseline: See objective data Goal status: Ongoing 06/07/2023   4.   Patient will demonstrate left LE MMT 5/5 throughout to faciltiate usual transfers, stairs, squatting at [facility] for daily life.  Baseline: See objective data Goal status: Ongoing 06/07/2023   5.  Patient ambulates >500' without device and negotiates ramps, curbs and stairs single rail alternating pattern modified independent.  Baseline: See objective data Goal status: MET 06/15/2023   6.  Left knee AROM 0* - 105* Baseline: See objective data Goal status: Ongoing 06/07/2023   7.  patient demonstrates ability to safely perform work related tasks.  Baseline: See objective data Goal status:   Ongoing  06/07/2023  PLAN:  PT FREQUENCY:  2-3x/week  PT DURATION: 10 weeks  PLANNED INTERVENTIONS: Therapeutic exercises, Therapeutic activity, Neuro Muscular re-education, Balance training, Gait training, Patient/Family education, Joint mobilization, Stair training, DME instructions, Dry Needling, Electrical stimulation, Traction, Cryotherapy, vasopneumatic deviceMoist heat, Taping, Ultrasound, Ionotophoresis 4mg /ml Dexamethasone, and aquatic therapy, Manual therapy.  All included unless contraindicated  PLAN FOR NEXT SESSION:  ,Do y balance reaches after bike as he gets with tired wit these.  Continue thigh strength work.   Ivery Quale, PT, DPT 06/21/23 3:28 PM

## 2023-06-23 ENCOUNTER — Encounter: Payer: BC Managed Care – PPO | Admitting: Rehabilitative and Restorative Service Providers"

## 2023-06-25 ENCOUNTER — Ambulatory Visit (INDEPENDENT_AMBULATORY_CARE_PROVIDER_SITE_OTHER): Payer: BC Managed Care – PPO | Admitting: Rehabilitative and Restorative Service Providers"

## 2023-06-25 ENCOUNTER — Encounter: Payer: Self-pay | Admitting: Rehabilitative and Restorative Service Providers"

## 2023-06-25 DIAGNOSIS — R2689 Other abnormalities of gait and mobility: Secondary | ICD-10-CM | POA: Diagnosis not present

## 2023-06-25 DIAGNOSIS — M25562 Pain in left knee: Secondary | ICD-10-CM | POA: Diagnosis not present

## 2023-06-25 DIAGNOSIS — R600 Localized edema: Secondary | ICD-10-CM

## 2023-06-25 DIAGNOSIS — M25662 Stiffness of left knee, not elsewhere classified: Secondary | ICD-10-CM

## 2023-06-25 DIAGNOSIS — M6281 Muscle weakness (generalized): Secondary | ICD-10-CM | POA: Diagnosis not present

## 2023-06-25 NOTE — Therapy (Signed)
OUTPATIENT PHYSICAL THERAPY TREATMENT NOTE  Patient Name: Jesse Howe MRN: [id number] DOB:[birth year], 18 y.o., male Today's Date: 06/25/2023  END OF SESSION:  PT End of Session - 06/25/23 1356     Visit Number 18    Number of Visits 25    Date for PT Re-Evaluation 07/09/23    Authorization Type BCBS & Medicaid    Progress Note Due on Visit 24    PT Start Time 1351    PT Stop Time 1430    PT Time Calculation (min) 39 min    Activity Tolerance Patient tolerated treatment well;No increased pain;Patient limited by fatigue    Behavior During Therapy [facility] for tasks assessed/performed             Past Medical History:  Diagnosis Date   Allergy    RHINITIS   Asperger syndrome    sees psychiatry and counseling   Asthma    [employer] 08/16/2020   Developmental disorder    Insomnia    Past Surgical History:  Procedure Laterality Date   ANTERIOR CRUCIATE LIGAMENT REPAIR Right 04/07/2021   Procedure: RIGHT KNEE RECONSTRUCTION ANTERIOR CRUCIATE LIGAMENT (ACL) WITH QUADRICEPS AUTOGRAFT, LATERAL MENISCAL DEBRIDEMENT;  Surgeon: Cammy Copa, MD;  Location: [facility];  Service: Orthopedics;  Laterality: Right;   ANTERIOR CRUCIATE LIGAMENT REPAIR Left 04/19/2023   Procedure: LEFT KNEE ARTHROSCOPY ANTERIOR CRUCIATE LIGAMENT (ACL) RECONSTRUCTION WITH QUADRICEP GRAFT;  Surgeon: Cammy Copa, MD;  Location: [facility];  Service: Orthopedics;  Laterality: Left;   TONSILLECTOMY AND ADENOIDECTOMY Bilateral March 2011   Patient Active Problem List   Diagnosis Date Noted   Left anterior cruciate ligament tear 04/25/2023   Rupture of anterior cruciate ligament of right knee    Acute lateral meniscus tear of right knee    Complete tear of right ACL, initial encounter 03/18/2021   Acute medial meniscus tear of right knee 03/18/2021   Abnormal finding on EKG 06/19/2019   Need for prophylactic vaccination and inoculation against influenza 08/09/2018   Depression in  pediatric patient 08/06/2018   Rhinitis, allergic 02/21/2015   Asthma, moderate persistent 02/21/2015   Encounter for follow-up 02/21/2015   Need for HPV vaccination 02/21/2015   Vaccine counseling 02/21/2015   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  No complaints of instability since his last PT visit.    PERTINENT HISTORY: Left ACL reconstruction with quad autograft 04/19/23, right ACL reconstruction with quad autograft 04/07/21, asthma, 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, requiring support, developmental disorder  PAIN:  NPRS scale: 0-3/10 (3/10 with fatigue/exercise) Pain location: Left knee inside joint Pain description: Achy mid to late day, gets better with rest Aggravating factors: Too much WB Relieving factors: CBD gummie  PRECAUTIONS: Knee  WEIGHT BEARING RESTRICTIONS: Yes LLE WBAT  FALLS:  Has patient fallen in last 6 months? Only with initial injury  LIVING ENVIRONMENT: Lives with: lives with their family and 2 dogs ~100#   Lives  in: House  ranch with basement (his bedroom) but staying upstairs for now. Stairs: Yes: Internal: 14 steps; on right going up and ramp at main entrance Has following equipment at home: Crutches and Bledsoe brace  OCCUPATION: works at [employer] - lift up to 75#, on feet for 5  hours  PLOF: Independent  PATIENT GOALS:  return to work,  function in community.  Next MD visit:  05/24/2023  OBJECTIVE:  DIAGNOSTIC FINDINGS: MRI 03/29/23: impression - 1. Complete ACL tear. 2. No meniscal tear of the left knee. 3. Osseous contusions of the posterolateral tibial plateau, posteromedial tibial plateau and peripheral corner of the medial femoral condyle. 4. Large joint effusion.  PATIENT SURVEYS:  06/07/2023 FOTO visit 12  55%  Eval:  FOTO intake:  43%  predicted:  69%  COGNITION: Overall cognitive status: WFL    SENSATION: WFL  EDEMA:  Left knee & calf edematous.    PALPATION: Tenderness to touch along entire joint line, patella tendon and quadriceps tendon.  LOWER EXTREMITY ROM:   ROM Left eval Left 05/06/23 Left/Right Active 05/14/2023 Left 05/19/23 Left 05/24/23 Left 06/03/23 Left 06/07/23 Left 06/15/23 Left/Right 06/25/23  Hip flexion           Hip extension           Hip abduction           Hip adduction           Hip internal rotation           Hip external rotation           Knee flexion P: 72* P: 85 78/142 86 AROM heelslide sitting AA: 90  A:95 P:99  Standing //bar P: 113  Supine: A: 102 P:110 129/138  Knee extension P: -6*  0/0    Seated A: LAQ -6* Supine:A: 0 0/0  Ankle dorsiflexion           Ankle plantarflexion           Ankle inversion           Ankle eversion            (Blank rows = not tested)  LOWER EXTREMITY MMT:  MMT Right eval Left eval Left 06/02/22  Hip flexion     Hip extension     Hip abduction     Hip adduction     Hip internal rotation     Hip external rotation     Knee flexion  3-/5 4  Knee extension  3-/5 4  Ankle dorsiflexion     Ankle plantarflexion     Ankle inversion     Ankle eversion      (Blank rows = not tested)  FUNCTIONAL TESTS:  18 inch chair transfer: Modified independent utilizing bilateral upper extremities with no weight on left lower extremity.   GAIT: 06/15/2023: -pt able to neg  ramp & curb without AD and no knee instability noted.   Eval:  Distance walked: 7ft x2 Assistive device utilized: Crutches without brace then without crutches for second bout of ambulation Level of assistance: SBA / verbal cues Comments: Nonweightbearing left lower extremity  TODAY'S TREATMENT  DATE:   06/25/2023 Ball squats with mini-bounce (hips above knees, do not let knees go past toes) 10 x 5 mini-bounces with BFR 144 mm Hg Bridging with Hamstrings curls with heels on Swiss Ball 15 x 3 seconds Supine knee flexion stretch with strap 20 sec X 5  Neuromuscular re-education: Y balance, standing left and reaching right 10 x Single leg balance on foam while tossing tennis ball for balance 2 x 1 minute  Functional Activities: TRX squats, emphasis on lower extremity work 2 sets of 10 slow eccentrics with BFR 144 mm Hg   06/21/2023 Recumbent bike L6 X 8 min Standing quad stretch with strap 20 sec X 5 TRX squats 2X10 Step ups 6 inch step without UE support X 19 (he thought he was doing 2 sets so was past the prescribed 15 reps) forward, X 15 lateral Leg press left leg only 62 #2X10, then DL 161# 0R60 Y reaches  A54 each way Seated SLR 2# 3X10   06/18/2023 Recumbent bike Seat 10 for 5 minutes Level 8 with BFR training cuff size 3 with LOP 180 mmHG in sitting, exercised at 80%  Ball squats with mini-bounce (hips above knees, do not let knees go past toes) 12 x 5 mini-bounces Bridging with Hamstrings curls with heels on Swiss Ball 15 x 3 seconds Seated straight leg raises 3 sets of 5 with 1#  Neuromuscular re-education Dynamic High step Tandem forward and backward walking on foam 2 laps Single leg balance on floor while tossing tennis ball for balance 5 x 30 seconds Y reaching 10 x    PATIENT EDUCATION:  Education details: HEP, POC Person educated: Patient Education method: Programmer, multimedia, Facilities manager,  Verbal cues, and Handouts Education comprehension: verbalized understanding, returned demonstration, and verbal cues required  HOME EXERCISE PROGRAM: Access Code: [email or web address] URL: [email or web address] Date: 05/31/2023 Prepared by: Vladimir Faster  Exercises - Standing Quad Set  - 3-4 x daily - 7 x weekly - 2-3 sets - 10 reps - 5 seconds hold - Seated Quad Set  - 3-4 x daily - 7 x weekly - 2-3 sets - 10 reps - 5 seconds hold - Ankle Alphabet in Elevation  - 2 x daily - 7 x weekly - 1 sets - >15 min hold - Seated Hamstring Set  - 3 x daily - 7 x weekly - 10 reps - 5 seconds hold - Seated Heel Slide  - 3 x daily - 7 x weekly - 10 reps - 5 seconds hold - standing knee extension  - 1 x daily - 7 x weekly - 4 sets - 30-15 reps - 5 seconds hold   ASSESSMENT: CLINICAL IMPRESSION: AROM is 0 - 129 degrees (0 - 138 degrees on the uninvolved right).  Emphasis remains proximal/thigh strength, proprioception and stability.  Enoc appears on-track to meet long-term goals in the expected time.  OBJECTIVE IMPAIRMENTS: Abnormal gait, decreased activity tolerance, decreased balance, decreased knowledge of use of DME, decreased mobility, difficulty walking, decreased ROM, decreased strength, increased edema, increased muscle spasms, and pain.   ACTIVITY LIMITATIONS: carrying, lifting, bending, standing, squatting, stairs, transfers, and locomotion level  PARTICIPATION LIMITATIONS: meal prep, cleaning, laundry, community activity, and occupation  PERSONAL FACTORS: 1-2 comorbidities: see PMH  are also affecting patient's functional outcome.   REHAB POTENTIAL: Good  CLINICAL DECISION MAKING: Stable/uncomplicated  EVALUATION COMPLEXITY: Low   GOALS: Goals reviewed with patient? Yes  SHORT TERM GOALS: (target date for Short term goals 06/02/2022)   1.  Patient will demonstrate  independent use of home exercise program to maintain progress from in clinic treatments. Baseline: See objective  data Goal status: Met 05/14/2023  2. PROM left knee 0* ext to 100* flexion Baseline: See objective data Goal status: Met 06/25/2023  3. Interim FOTO 50% Baseline: See objective data Goal status:  MET  06/07/2023  LONG TERM GOALS: (target dates for all long term goals  07/08/2022 )   1. Patient will demonstrate/report pain at worst less than or equal to 2/10 to facilitate minimal limitation in daily activity secondary to pain symptoms. Baseline: See objective data Goal status: Ongoing 06/25/2023   2. Patient will demonstrate independent use of home exercise program to facilitate ability to maintain/progress functional gains from skilled physical therapy services. Baseline: See objective data Goal status: Ongoing 06/25/2023   3. Patient will demonstrate FOTO outcome > or = 69 % to indicate reduced disability due to condition. Baseline: See objective data Goal status: Ongoing 06/07/2023   4.  Patient will demonstrate left LE MMT 5/5 throughout to faciltiate usual transfers, stairs, squatting at [facility] for daily life.  Baseline: See objective data Goal status: Ongoing 06/07/2023   5.  Patient ambulates >500' without device and negotiates ramps, curbs and stairs single rail alternating pattern modified independent.  Baseline: See objective data Goal status: MET 06/15/2023   6.  Left knee AROM 0* - 105* Baseline: See objective data Goal status: Met 06/25/2023   7.  patient demonstrates ability to safely perform work related tasks.  Baseline: See objective data Goal status:   Ongoing  06/25/2023  PLAN:  PT FREQUENCY:  2-3x/week  PT DURATION: 10 weeks  PLANNED INTERVENTIONS: Therapeutic exercises, Therapeutic activity, Neuro Muscular re-education, Balance training, Gait training, Patient/Family education, Joint mobilization, Stair training, DME instructions, Dry Needling, Electrical stimulation, Traction, Cryotherapy, vasopneumatic deviceMoist heat, Taping, Ultrasound, Ionotophoresis 4mg /ml  Dexamethasone, and aquatic therapy, Manual therapy.  All included unless contraindicated  PLAN FOR NEXT SESSION:  Do y balance reaches after bike as he gets with tired with these.  Continue thigh strength work.   Ivery Quale, PT, DPT 06/25/23 3:32 PM

## 2023-06-28 ENCOUNTER — Encounter: Payer: Self-pay | Admitting: Physical Therapy

## 2023-06-28 ENCOUNTER — Ambulatory Visit (INDEPENDENT_AMBULATORY_CARE_PROVIDER_SITE_OTHER): Payer: BC Managed Care – PPO | Admitting: Physical Therapy

## 2023-06-28 DIAGNOSIS — R2689 Other abnormalities of gait and mobility: Secondary | ICD-10-CM

## 2023-06-28 DIAGNOSIS — M6281 Muscle weakness (generalized): Secondary | ICD-10-CM | POA: Diagnosis not present

## 2023-06-28 DIAGNOSIS — M25562 Pain in left knee: Secondary | ICD-10-CM | POA: Diagnosis not present

## 2023-06-28 DIAGNOSIS — M25662 Stiffness of left knee, not elsewhere classified: Secondary | ICD-10-CM | POA: Diagnosis not present

## 2023-06-28 DIAGNOSIS — R600 Localized edema: Secondary | ICD-10-CM

## 2023-06-28 NOTE — Therapy (Addendum)
OUTPATIENT PHYSICAL THERAPY TREATMENT NOTE  Patient Name: Jesse Howe MRN: [id number] DOB:[birth year], 18 y.o., male Today's Date: 06/28/2023  END OF SESSION:  PT End of Session - 06/28/23 1517     Visit Number 19    Number of Visits 25    Date for PT Re-Evaluation 07/09/23    Authorization Type BCBS & Medicaid    Progress Note Due on Visit 24    PT Start Time 1430    PT Stop Time 1515    PT Time Calculation (min) 45 min    Activity Tolerance Patient tolerated treatment well;No increased pain    Behavior During Therapy [facility] for tasks assessed/performed              Past Medical History:  Diagnosis Date   Allergy    RHINITIS   Asperger syndrome    sees psychiatry and counseling   Asthma    [employer] 08/16/2020   Developmental disorder    Insomnia    Past Surgical History:  Procedure Laterality Date   ANTERIOR CRUCIATE LIGAMENT REPAIR Right 04/07/2021   Procedure: RIGHT KNEE RECONSTRUCTION ANTERIOR CRUCIATE LIGAMENT (ACL) WITH QUADRICEPS AUTOGRAFT, LATERAL MENISCAL DEBRIDEMENT;  Surgeon: Cammy Copa, MD;  Location: [facility];  Service: Orthopedics;  Laterality: Right;   ANTERIOR CRUCIATE LIGAMENT REPAIR Left 04/19/2023   Procedure: LEFT KNEE ARTHROSCOPY ANTERIOR CRUCIATE LIGAMENT (ACL) RECONSTRUCTION WITH QUADRICEP GRAFT;  Surgeon: Cammy Copa, MD;  Location: [facility];  Service: Orthopedics;  Laterality: Left;   TONSILLECTOMY AND ADENOIDECTOMY Bilateral March 2011   Patient Active Problem List   Diagnosis Date Noted   Left anterior cruciate ligament tear 04/25/2023   Rupture of anterior cruciate ligament of right knee    Acute lateral meniscus tear of right knee    Complete tear of right ACL, initial encounter 03/18/2021   Acute medial meniscus tear of right knee 03/18/2021   Abnormal finding on EKG 06/19/2019   Need for prophylactic vaccination and inoculation against influenza 08/09/2018   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    Rhinitis, allergic 02/21/2015   Asthma, moderate persistent 02/21/2015   Encounter for follow-up 02/21/2015   Need for HPV vaccination 02/21/2015   Vaccine counseling 02/21/2015   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 Weekend was uneventful.     PERTINENT HISTORY: Left ACL reconstruction with quad autograft 04/19/23, right ACL reconstruction with quad autograft 04/07/21, asthma, 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, requiring support, developmental disorder  PAIN:  NPRS scale: no pain, at worst feels a slight pain in the evening after being on it for a while but quickly resolves with rest, no pain rating given Pain location: Left knee inside joint Pain description: Achy mid to late day, gets better with rest Aggravating factors: Too much WB Relieving factors: CBD gummie  PRECAUTIONS: Knee  WEIGHT BEARING RESTRICTIONS: Yes LLE WBAT  FALLS:  Has patient fallen in last 6  months? Only with initial injury  LIVING ENVIRONMENT: Lives with: lives with their family and 2 dogs ~100#   Lives in: [place] with basement (his bedroom) but staying upstairs for now. Stairs: Yes: Internal: 14 steps; on right going up and ramp at main entrance Has following equipment at home: Crutches and Bledsoe brace  OCCUPATION: works at [employer] - lift up to 75#, on feet for 5 hours  PLOF: Independent  PATIENT GOALS:  return to work,  function in community.  Next MD visit:  05/24/2023  OBJECTIVE:  DIAGNOSTIC FINDINGS: MRI 03/29/23: impression - 1. Complete ACL tear. 2. No meniscal tear of the left knee. 3. Osseous contusions of the posterolateral tibial plateau, posteromedial tibial plateau and peripheral corner of the medial femoral condyle. 4. Large joint effusion.  PATIENT SURVEYS:  06/07/2023 FOTO visit 12  55%  Eval:  FOTO intake:  43%  predicted:  69%  COGNITION: Overall cognitive status: WFL    SENSATION: WFL  EDEMA:  Left knee & calf edematous.    PALPATION: Tenderness to touch along entire joint line, patella tendon and quadriceps tendon.  LOWER EXTREMITY ROM:   ROM Left eval Left 05/06/23 Left/Right Active 05/14/2023 Left 05/19/23 Left 05/24/23 Left 06/03/23 Left 06/07/23 Left 06/15/23 Left/Right 06/25/23  Hip flexion           Hip extension           Hip abduction           Hip adduction           Hip internal rotation           Hip external rotation           Knee flexion P: 72* P: 85 78/142 86 AROM heelslide sitting AA: 90  A:95 P:99  Standing //bar P: 113  Supine: A: 102 P:110 129/138  Knee extension P: -6*  0/0    Seated A: LAQ -6* Supine:A: 0 0/0  Ankle dorsiflexion           Ankle plantarflexion           Ankle inversion           Ankle eversion            (Blank rows = not tested)  LOWER EXTREMITY MMT:  MMT Left eval Left 06/02/22  Hip flexion    Hip extension    Hip abduction    Hip adduction    Hip internal rotation    Hip external rotation    Knee flexion 3-/5 4  Knee extension 3-/5 4  Ankle dorsiflexion    Ankle plantarflexion    Ankle inversion    Ankle eversion     (Blank rows = not tested)  FUNCTIONAL TESTS:  18 inch chair transfer: Modified independent utilizing bilateral upper extremities with no weight on left lower extremity.    GAIT: 06/15/2023: -pt able to neg ramp & curb without AD and no knee instability noted.   Eval:  Distance walked: 40ft x2 Assistive device utilized: Crutches without brace then without crutches for second bout of ambulation Level of assistance: SBA / verbal cues Comments: Nonweightbearing left lower extremity  TODAY'S TREATMENT  DATE:   06/28/2023 Therex: -Recumbent bike L6 X 8 min   Theract: -Kettle bell squats 15lb x15 -Ramp squats 15lb standing uphill/downhill x5ea; vc for proper form and alignment  Neuromuscular re-education: -Y balance, standing left and reaching right x10; vc and demo for proper weight shift and knee bend for anterior push -Single leg balance on foam while tossing basketball with Green TB TKE resistance applied at varied planes x15 toses 2x -Lateral step up on bosu (round side up) with reach across ant and post UE assist on //bar intermittently -Leg press jumps 75lb BLE, single leg lands (BLE jump to LLE land) 50lb x15ea, alternating SL jump land (R jump to L land then alternate); vc for controlled landing 37# 10 reps -BAPS board level 3 CW/CCW x5ea then level 2 CW/CCW x5ea; light to heavy UE hold on rail for support once fatigued   TREATMENT                                                                          DATE:   06/25/2023 Ball squats with mini-bounce (hips above knees, do not let knees go past toes) 10 x 5 mini-bounces with BFR 144 mm Hg Bridging with Hamstrings curls with heels on Swiss Ball 15 x 3 seconds Supine knee flexion stretch with strap 20 sec X 5  Neuromuscular re-education: Y balance, standing left and reaching right 10 x Single leg balance on foam while tossing tennis ball for balance 2 x 1 minute  Functional Activities: TRX squats, emphasis on lower extremity work 2 sets of 10 slow eccentrics with BFR 144 mm Hg   06/21/2023 Recumbent bike L6 X 8 min Standing  quad stretch with strap 20 sec X 5 TRX squats 2X10 Step ups 6 inch step without UE support X 19 (he thought he was doing 2 sets so was past the prescribed 15 reps) forward, X 15 lateral Leg press left leg only 62 #2X10, then DL 782# 9F62 Y reaches  Z30 each way Seated SLR 2# 3X10    PATIENT EDUCATION:  Education details: HEP, POC Person educated: Patient Education method: Programmer, multimedia, Demonstration, Verbal cues, and Handouts Education comprehension: verbalized understanding, returned demonstration, and verbal cues required  HOME EXERCISE PROGRAM: Access Code: [email or web address] URL: [email or web address] Date: 05/31/2023 Prepared by: Vladimir Faster  Exercises - Standing Quad Set  - 3-4 x daily - 7 x weekly - 2-3 sets - 10 reps - 5 seconds hold - Seated Quad Set  - 3-4 x daily - 7 x weekly - 2-3 sets - 10 reps - 5 seconds hold - Ankle Alphabet in Elevation  - 2 x daily - 7 x weekly - 1 sets - >15 min hold - Seated Hamstring Set  - 3 x daily - 7 x weekly - 10 reps - 5 seconds hold - Seated Heel Slide  - 3 x daily - 7 x weekly - 10 reps - 5 seconds hold - standing knee extension  - 1 x daily - 7 x weekly - 4 sets - 30-15 reps - 5 seconds hold   ASSESSMENT: CLINICAL IMPRESSION: Patient continues to maintain good progress demonstrating increased control with introduction into jumping via leg press. Patient was able to jump and control  descent with mild medial/lateral deviation of knee during activity on leg press. Patient will continue to benefit from skilled physical therapy to address deficits and ensure safety when returning to work and PLOF.  OBJECTIVE IMPAIRMENTS: Abnormal gait, decreased activity tolerance, decreased balance, decreased knowledge of use of DME, decreased mobility, difficulty walking, decreased ROM, decreased strength, increased edema, increased muscle spasms, and pain.   ACTIVITY LIMITATIONS: carrying, lifting, bending, standing, squatting, stairs, transfers,  and locomotion level  PARTICIPATION LIMITATIONS: meal prep, cleaning, laundry, community activity, and occupation  PERSONAL FACTORS: 1-2 comorbidities: see PMH  are also affecting patient's functional outcome.   REHAB POTENTIAL: Good  CLINICAL DECISION MAKING: Stable/uncomplicated  EVALUATION COMPLEXITY: Low   GOALS: Goals reviewed with patient? Yes  SHORT TERM GOALS: (target date for Short term goals 06/02/2022)   1.  Patient will demonstrate independent use of home exercise program to maintain progress from in clinic treatments. Baseline: See objective data Goal status: Met 05/14/2023  2. PROM left knee 0* ext to 100* flexion Baseline: See objective data Goal status: Met 06/25/2023  3. Interim FOTO 50% Baseline: See objective data Goal status:  MET  06/07/2023  LONG TERM GOALS: (target dates for all long term goals  07/08/2022 )   1. Patient will demonstrate/report pain at worst less than or equal to 2/10 to facilitate minimal limitation in daily activity secondary to pain symptoms. Baseline: See objective data Goal status: Ongoing 06/25/2023   2. Patient will demonstrate independent use of home exercise program to facilitate ability to maintain/progress functional gains from skilled physical therapy services. Baseline: See objective data Goal status: Ongoing 06/25/2023   3. Patient will demonstrate FOTO outcome > or = 69 % to indicate reduced disability due to condition. Baseline: See objective data Goal status: Ongoing 06/07/2023   4.  Patient will demonstrate left LE MMT 5/5 throughout to faciltiate usual transfers, stairs, squatting at [facility] for daily life.  Baseline: See objective data Goal status: Ongoing 06/07/2023   5.  Patient ambulates >500' without device and negotiates ramps, curbs and stairs single rail alternating pattern modified independent.  Baseline: See objective data Goal status: MET 06/15/2023   6.  Left knee AROM 0* - 105* Baseline: See objective  data Goal status: Met 06/25/2023   7.  patient demonstrates ability to safely perform work related tasks.  Baseline: See objective data Goal status:   Ongoing  06/25/2023  PLAN:  PT FREQUENCY:  2-3x/week  PT DURATION: 10 weeks  PLANNED INTERVENTIONS: Therapeutic exercises, Therapeutic activity, Neuro Muscular re-education, Balance training, Gait training, Patient/Family education, Joint mobilization, Stair training, DME instructions, Dry Needling, Electrical stimulation, Traction, Cryotherapy, vasopneumatic deviceMoist heat, Taping, Ultrasound, Ionotophoresis 4mg /ml Dexamethasone, and aquatic therapy, Manual therapy.  All included unless contraindicated  PLAN FOR NEXT SESSION:  Continue plyometrics, TKE with resistance in varied planes while balancing, increase wt or reps for kettle bell squats (level ground and ramp)   Ivor Kishi, Evamae Rowen, Student-PT 06/28/2023, 3:56 PM   This entire session of physical therapy was performed under the direct supervision of PT signing evaluation /treatment. PT reviewed note and agrees.   Vladimir Faster, PT, DPT 06/28/2023, 4:59 PM

## 2023-06-30 ENCOUNTER — Encounter: Payer: Self-pay | Admitting: Physical Therapy

## 2023-06-30 ENCOUNTER — Ambulatory Visit (INDEPENDENT_AMBULATORY_CARE_PROVIDER_SITE_OTHER): Payer: BC Managed Care – PPO | Admitting: Physical Therapy

## 2023-06-30 DIAGNOSIS — R2689 Other abnormalities of gait and mobility: Secondary | ICD-10-CM

## 2023-06-30 DIAGNOSIS — M25562 Pain in left knee: Secondary | ICD-10-CM | POA: Diagnosis not present

## 2023-06-30 DIAGNOSIS — M25662 Stiffness of left knee, not elsewhere classified: Secondary | ICD-10-CM

## 2023-06-30 DIAGNOSIS — R600 Localized edema: Secondary | ICD-10-CM

## 2023-06-30 DIAGNOSIS — M6281 Muscle weakness (generalized): Secondary | ICD-10-CM | POA: Diagnosis not present

## 2023-06-30 NOTE — Therapy (Signed)
OUTPATIENT PHYSICAL THERAPY TREATMENT NOTE  Patient Name: Jesse Howe MRN: [id number] DOB:08-Apr-2006, 18 y.o., male Today's Date: 06/30/2023  END OF SESSION:  PT End of Session - 06/30/23 1434     Visit Number 20    Number of Visits 25    Date for PT Re-Evaluation 07/09/23    Authorization Type BCBS & Medicaid    Progress Note Due on Visit 24    PT Start Time 1431    PT Stop Time 1509    PT Time Calculation (min) 38 min    Activity Tolerance Patient tolerated treatment well;No increased pain    Behavior During Therapy [facility] for tasks assessed/performed               Past Medical History:  Diagnosis Date   Allergy    RHINITIS   Asperger syndrome    sees psychiatry and counseling   Asthma    [employer] 08/16/2020   Developmental disorder    Insomnia    Past Surgical History:  Procedure Laterality Date   ANTERIOR CRUCIATE LIGAMENT REPAIR Right 04/07/2021   Procedure: RIGHT KNEE RECONSTRUCTION ANTERIOR CRUCIATE LIGAMENT (ACL) WITH QUADRICEPS AUTOGRAFT, LATERAL MENISCAL DEBRIDEMENT;  Surgeon: Cammy Copa, MD;  Location: [facility];  Service: Orthopedics;  Laterality: Right;   ANTERIOR CRUCIATE LIGAMENT REPAIR Left 04/19/2023   Procedure: LEFT KNEE ARTHROSCOPY ANTERIOR CRUCIATE LIGAMENT (ACL) RECONSTRUCTION WITH QUADRICEP GRAFT;  Surgeon: Cammy Copa, MD;  Location: [facility];  Service: Orthopedics;  Laterality: Left;   TONSILLECTOMY AND ADENOIDECTOMY Bilateral March 2011   Patient Active Problem List   Diagnosis Date Noted   Left anterior cruciate ligament tear 04/25/2023   Rupture of anterior cruciate ligament of right knee    Acute lateral meniscus tear of right knee    Complete tear of right ACL, initial encounter 03/18/2021   Acute medial meniscus tear of right knee 03/18/2021   Abnormal finding on EKG 06/19/2019   Need for prophylactic vaccination and inoculation against influenza 08/09/2018   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    Rhinitis, allergic 02/21/2015   Asthma, moderate persistent 02/21/2015   Encounter for follow-up 02/21/2015   Need for HPV vaccination 02/21/2015   Vaccine counseling 02/21/2015   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, requiring support (level 1) 12/07/2013   Developmental disorder 05/11/2013    PCP: Ronnald Nian, MD  REFERRING PROVIDER: Tarry Kos, MD  REFERRING DIAG: [id number] (ICD-10-CM) - S/P ACL reconstruction   THERAPY DIAG:  Muscle weakness (generalized)  Acute pain of left knee  Stiffness of left knee, not elsewhere classified  Other abnormalities of gait and mobility  Localized edema  Rationale for Evaluation and Treatment: Rehabilitation  ONSET DATE: 04/19/2023 Left knee ACL arthroscopy  SUBJECTIVE:   SUBJECTIVE STATEMENT: He had some muscle soreness yesterday from new activities in PT Monday but no more than expected and did not limit exercises.      PERTINENT HISTORY: Left ACL reconstruction with quad autograft 04/19/23, right ACL reconstruction with quad autograft 04/07/21, asthma, 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, requiring support, developmental disorder  PAIN:  NPRS scale: no pain, at worst feels a slight pain in the evening after being on it for a while but quickly resolves with rest, no pain rating given Pain location: Left knee inside joint Pain description: Achy mid to late day, gets better with rest Aggravating factors: Too much WB Relieving factors: CBD gummie  PRECAUTIONS: Knee  WEIGHT BEARING RESTRICTIONS: Yes LLE  WBAT  FALLS:  Has patient fallen in last 6 months? Only with initial injury  LIVING ENVIRONMENT: Lives with: lives with their family and 2 dogs ~100#   Lives in: [place] with basement (his bedroom) but staying upstairs for now. Stairs: Yes: Internal: 14 steps; on right going up and ramp at main entrance Has following equipment at home: Crutches and  Bledsoe brace  OCCUPATION: works at [employer] - lift up to 75#, on feet for 5 hours  PLOF: Independent  PATIENT GOALS:  return to work,  function in community.  Next MD visit:  05/24/2023  OBJECTIVE:  DIAGNOSTIC FINDINGS: MRI 03/29/23: impression - 1. Complete ACL tear. 2. No meniscal tear of the left knee. 3. Osseous contusions of the posterolateral tibial plateau, posteromedial tibial plateau and peripheral corner of the medial femoral condyle. 4. Large joint effusion.  PATIENT SURVEYS:  06/07/2023 FOTO visit 12  55%  Eval:  FOTO intake:  43%  predicted:  69%  COGNITION: Overall cognitive status: WFL    SENSATION: WFL  EDEMA:  Left knee & calf edematous.    PALPATION: Tenderness to touch along entire joint line, patella tendon and quadriceps tendon.  LOWER EXTREMITY ROM:   ROM Left eval Left 05/06/23 Left/Right Active 05/14/2023 Left 05/19/23 Left 05/24/23 Left 06/03/23 Left 06/07/23 Left 06/15/23 Left/Right 06/25/23  Hip flexion           Hip extension           Hip abduction           Hip adduction           Hip internal rotation           Hip external rotation           Knee flexion P: 72* P: 85 78/142 86 AROM heelslide sitting AA: 90  A:95 P:99  Standing //bar P: 113  Supine: A: 102 P:110 129/138  Knee extension P: -6*  0/0    Seated A: LAQ -6* Supine:A: 0 0/0  Ankle dorsiflexion           Ankle plantarflexion           Ankle inversion           Ankle eversion            (Blank rows = not tested)  LOWER EXTREMITY MMT:  MMT Left eval Left 06/02/22  Hip flexion    Hip extension    Hip abduction    Hip adduction    Hip internal rotation    Hip external rotation    Knee flexion 3-/5 4  Knee extension 3-/5 4  Ankle dorsiflexion    Ankle plantarflexion    Ankle inversion    Ankle eversion     (Blank rows = not tested)  FUNCTIONAL TESTS:  18 inch chair transfer: Modified independent utilizing bilateral upper extremities with no weight  on left lower extremity.   GAIT: 06/15/2023: -pt able to neg ramp & curb without AD and no knee instability noted.   Eval:  Distance walked: 52ft x2 Assistive device utilized: Crutches without brace then without crutches for second bout of ambulation Level of assistance: SBA / verbal cues Comments: Nonweightbearing left lower extremity  TODAY'S TREATMENT  DATE:   06/30/2023 Therex: -Recumbent bike L6 X 8 min   Theract: -Kettle bell squats 20lb x15; PT  -Ramp squats 20lb standing uphill/downhill x5ea; vc for proper form and alignment  Neuromuscular re-education: -Y balance, standing LLE on foam beam and reaching right x10; intermittent touch //bars -BAPS board level 2 CW/CCW x5ea heavy BUE support //bars -Single leg balance on foam while tossing basketball with Green TB TKE resistance applied at varied planes x15 toses 2x -Lateral step up on BOSU (round side up) with reach across ant and post UE assist on //bar intermittently -Leg press jumps 75lb BLEs lift & land 15 reps;  single leg lands (BLE jump to LLE land) 50lb x15ea;   alternating SL jump land (R jump to L land then alternate); vc for controlled landing 37# 15 reps   TREATMENT                                                                          DATE:   06/28/2023 Therex: -Recumbent bike L8 X 6 min   Theract: -Kettle bell squats 15lb x15 -Ramp squats 15lb standing uphill/downhill x5ea; vc for proper form and alignment -stairs alternating pattern no rails but slow cautious speed -jumping off last step landing on BLEs with knees flexed 10 reps -side to side jumping 30 sec 2 sets 30 sec rests -forward / backward jumping 30 sec 2 sets 30 sec rests  Neuromuscular re-education: -Y balance, standing left and reaching right x10; vc and demo for proper weight shift and knee bend for anterior push -Single leg balance on foam while tossing basketball with  Green TB TKE resistance applied at varied planes x15 toses 2x -Lateral step up on bosu (round side up) with reach across ant and post UE assist on //bar intermittently -Leg press jumps 75lb BLE, single leg lands (BLE jump to LLE land) 50lb x15ea, alternating SL jump land (R jump to L land then alternate); vc for controlled landing 37# 10 reps -BAPS board level 3 CW/CCW x5ea then level 2 CW/CCW x5ea; light to heavy UE hold on rail for support once fatigued   TREATMENT                                                                          DATE:   06/25/2023 Ball squats with mini-bounce (hips above knees, do not let knees go past toes) 10 x 5 mini-bounces with BFR 144 mm Hg Bridging with Hamstrings curls with heels on Swiss Ball 15 x 3 seconds Supine knee flexion stretch with strap 20 sec X 5  Neuromuscular re-education: Y balance, standing left and reaching right 10 x Single leg balance on foam while tossing tennis ball for balance 2 x 1 minute  Functional Activities: TRX squats, emphasis on lower extremity work 2 sets of 10 slow eccentrics with BFR 144 mm Hg    PATIENT EDUCATION:  Education details: HEP, POC Person educated: Patient Education method: Programmer, multimedia, Demonstration,  Verbal cues, and Handouts Education comprehension: verbalized understanding, returned demonstration, and verbal cues required  HOME EXERCISE PROGRAM: Access Code: [email or web address] URL: [email or web address] Date: 05/31/2023 Prepared by: Vladimir Faster  Exercises - Standing Quad Set  - 3-4 x daily - 7 x weekly - 2-3 sets - 10 reps - 5 seconds hold - Seated Quad Set  - 3-4 x daily - 7 x weekly - 2-3 sets - 10 reps - 5 seconds hold - Ankle Alphabet in Elevation  - 2 x daily - 7 x weekly - 1 sets - >15 min hold - Seated Hamstring Set  - 3 x daily - 7 x weekly - 10 reps - 5 seconds hold - Seated Heel Slide  - 3 x daily - 7 x weekly - 10 reps - 5 seconds hold - standing knee extension  - 1 x daily - 7 x  weekly - 4 sets - 30-15 reps - 5 seconds hold   ASSESSMENT: CLINICAL IMPRESSION: Patient is responding to Plyometric activities with no noted knee instability.  He continues to be challenged by compliant activities with rotational stabilization required.  Pt may benefit from recertification next week at 1x/wk frequency with emphasis on progressive HEP.    OBJECTIVE IMPAIRMENTS: Abnormal gait, decreased activity tolerance, decreased balance, decreased knowledge of use of DME, decreased mobility, difficulty walking, decreased ROM, decreased strength, increased edema, increased muscle spasms, and pain.   ACTIVITY LIMITATIONS: carrying, lifting, bending, standing, squatting, stairs, transfers, and locomotion level  PARTICIPATION LIMITATIONS: meal prep, cleaning, laundry, community activity, and occupation  PERSONAL FACTORS: 1-2 comorbidities: see PMH  are also affecting patient's functional outcome.   REHAB POTENTIAL: Good  CLINICAL DECISION MAKING: Stable/uncomplicated  EVALUATION COMPLEXITY: Low   GOALS: Goals reviewed with patient? Yes  SHORT TERM GOALS: (target date for Short term goals 06/02/2022)   1.  Patient will demonstrate independent use of home exercise program to maintain progress from in clinic treatments. Baseline: See objective data Goal status: Met 05/14/2023  2. PROM left knee 0* ext to 100* flexion Baseline: See objective data Goal status: Met 06/25/2023  3. Interim FOTO 50% Baseline: See objective data Goal status:  MET  06/07/2023  LONG TERM GOALS: (target dates for all long term goals  07/08/2022 )   1. Patient will demonstrate/report pain at worst less than or equal to 2/10 to facilitate minimal limitation in daily activity secondary to pain symptoms. Baseline: See objective data Goal status: Ongoing 06/25/2023   2. Patient will demonstrate independent use of home exercise program to facilitate ability to maintain/progress functional gains from skilled physical  therapy services. Baseline: See objective data Goal status: Ongoing 06/25/2023   3. Patient will demonstrate FOTO outcome > or = 69 % to indicate reduced disability due to condition. Baseline: See objective data Goal status: Ongoing 06/07/2023   4.  Patient will demonstrate left LE MMT 5/5 throughout to faciltiate usual transfers, stairs, squatting at [facility] for daily life.  Baseline: See objective data Goal status: Ongoing 06/07/2023   5.  Patient ambulates >500' without device and negotiates ramps, curbs and stairs single rail alternating pattern modified independent.  Baseline: See objective data Goal status: MET 06/15/2023   6.  Left knee AROM 0* - 105* Baseline: See objective data Goal status: Met 06/25/2023   7.  patient demonstrates ability to safely perform work related tasks.  Baseline: See objective data Goal status:   Ongoing  06/25/2023  PLAN:  PT FREQUENCY:  2-3x/week  PT DURATION: 10 weeks  PLANNED INTERVENTIONS: Therapeutic exercises, Therapeutic activity, Neuro Muscular re-education, Balance training, Gait training, Patient/Family education, Joint mobilization, Stair training, DME instructions, Dry Needling, Electrical stimulation, Traction, Cryotherapy, vasopneumatic deviceMoist heat, Taping, Ultrasound, Ionotophoresis 4mg /ml Dexamethasone, and aquatic therapy, Manual therapy.  All included unless contraindicated  PLAN FOR NEXT SESSION:  check LTGs next week,  Continue plyometrics, TKE with resistance in varied planes while balancing, increase wt or reps for kettle bell squats (level ground and ramp)   Vladimir Faster, PT 06/30/2023, 3:12 PM

## 2023-07-02 ENCOUNTER — Encounter: Payer: Self-pay | Admitting: Rehabilitative and Restorative Service Providers"

## 2023-07-02 ENCOUNTER — Ambulatory Visit (INDEPENDENT_AMBULATORY_CARE_PROVIDER_SITE_OTHER): Payer: BC Managed Care – PPO | Admitting: Rehabilitative and Restorative Service Providers"

## 2023-07-02 DIAGNOSIS — R2689 Other abnormalities of gait and mobility: Secondary | ICD-10-CM

## 2023-07-02 DIAGNOSIS — M6281 Muscle weakness (generalized): Secondary | ICD-10-CM | POA: Diagnosis not present

## 2023-07-02 DIAGNOSIS — M25562 Pain in left knee: Secondary | ICD-10-CM | POA: Diagnosis not present

## 2023-07-02 DIAGNOSIS — M25662 Stiffness of left knee, not elsewhere classified: Secondary | ICD-10-CM

## 2023-07-02 DIAGNOSIS — R600 Localized edema: Secondary | ICD-10-CM

## 2023-07-02 NOTE — Therapy (Signed)
OUTPATIENT PHYSICAL THERAPY TREATMENT NOTE  Patient Name: Jesse Howe MRN: [id number] DOB:2006-02-14, 18 y.o., male Today's Date: 07/02/2023  END OF SESSION:  PT End of Session - 07/02/23 1512     Visit Number 21    Number of Visits 25    Date for PT Re-Evaluation 07/09/23    Authorization Type BCBS & Medicaid    Progress Note Due on Visit 24    PT Start Time 1511    PT Stop Time 1553    PT Time Calculation (min) 42 min    Activity Tolerance Patient tolerated treatment well;No increased pain    Behavior During Therapy [facility] for tasks assessed/performed                Past Medical History:  Diagnosis Date   Allergy    RHINITIS   Asperger syndrome    sees psychiatry and counseling   Asthma    [employer] 08/16/2020   Developmental disorder    Insomnia    Past Surgical History:  Procedure Laterality Date   ANTERIOR CRUCIATE LIGAMENT REPAIR Right 04/07/2021   Procedure: RIGHT KNEE RECONSTRUCTION ANTERIOR CRUCIATE LIGAMENT (ACL) WITH QUADRICEPS AUTOGRAFT, LATERAL MENISCAL DEBRIDEMENT;  Surgeon: Cammy Copa, MD;  Location: [facility];  Service: Orthopedics;  Laterality: Right;   ANTERIOR CRUCIATE LIGAMENT REPAIR Left 04/19/2023   Procedure: LEFT KNEE ARTHROSCOPY ANTERIOR CRUCIATE LIGAMENT (ACL) RECONSTRUCTION WITH QUADRICEP GRAFT;  Surgeon: Cammy Copa, MD;  Location: [facility];  Service: Orthopedics;  Laterality: Left;   TONSILLECTOMY AND ADENOIDECTOMY Bilateral March 2011   Patient Active Problem List   Diagnosis Date Noted   Left anterior cruciate ligament tear 04/25/2023   Rupture of anterior cruciate ligament of right knee    Acute lateral meniscus tear of right knee    Complete tear of right ACL, initial encounter 03/18/2021   Acute medial meniscus tear of right knee 03/18/2021   Abnormal finding on EKG 06/19/2019   Need for prophylactic vaccination and inoculation against influenza 08/09/2018   Depression in pediatric patient  08/06/2018   Rhinitis, allergic 02/21/2015   Asthma, moderate persistent 02/21/2015   Encounter for follow-up 02/21/2015   Need for HPV vaccination 02/21/2015   Vaccine counseling 02/21/2015   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, requiring support (level 1) 12/07/2013   Developmental disorder 05/11/2013    PCP: Ronnald Nian, MD  REFERRING PROVIDER: Cammy Copa, MD  REFERRING DIAG: [id number] (ICD-10-CM) - S/P ACL reconstruction   THERAPY DIAG:  Muscle weakness (generalized)  Acute pain of left knee  Stiffness of left knee, not elsewhere classified  Other abnormalities of gait and mobility  Localized edema  Rationale for Evaluation and Treatment: Rehabilitation  ONSET DATE: 04/19/2023 Left knee ACL arthroscopy  SUBJECTIVE:   SUBJECTIVE STATEMENT: Jesse Howe reports he feels   PERTINENT HISTORY: Left ACL reconstruction with quad autograft 04/19/23, right ACL reconstruction with quad autograft 04/07/21, asthma, 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, requiring support, developmental disorder  PAIN:  NPRS scale: 0-3/10 this week Pain location: Left knee inside joint Pain description: Achy Aggravating factors: Too much WB, prolonged knee flexion, like in a car Relieving factors: CBD gummie (only 1 this week)  PRECAUTIONS: Knee  WEIGHT BEARING RESTRICTIONS: Yes LLE WBAT  FALLS:  Has patient fallen in last 6 months? Only with initial injury  LIVING ENVIRONMENT: Lives with: lives with their family and 2 dogs ~100#   Lives in: [place] with basement (his bedroom) but staying  upstairs for now. Stairs: Yes: Internal: 14 steps; on right going up and ramp at main entrance Has following equipment at home: Crutches and Bledsoe brace  OCCUPATION: works at [employer] - lift up to 75#, on feet for 5 hours  PLOF: Independent  PATIENT GOALS:  return to work,  function in community.  Next MD  visit:  05/24/2023  OBJECTIVE:  DIAGNOSTIC FINDINGS: MRI 03/29/23: impression - 1. Complete ACL tear. 2. No meniscal tear of the left knee. 3. Osseous contusions of the posterolateral tibial plateau, posteromedial tibial plateau and peripheral corner of the medial femoral condyle. 4. Large joint effusion.  PATIENT SURVEYS:  07/02/2023 FOTO 57  06/07/2023 FOTO visit 12  55%  Eval:  FOTO intake:  43%  predicted:  69%  COGNITION: Overall cognitive status: WFL    SENSATION: WFL  EDEMA:  Left knee & calf edematous.    PALPATION: Tenderness to touch along entire joint line, patella tendon and quadriceps tendon.  LOWER EXTREMITY ROM:   ROM Left eval Left 05/06/23 Left/Right Active 05/14/2023 Left 05/19/23 Left 05/24/23 Left 06/03/23 Left 06/07/23 Left 06/15/23 Left/Right 06/25/23  Hip flexion           Hip extension           Hip abduction           Hip adduction           Hip internal rotation           Hip external rotation           Knee flexion P: 72* P: 85 78/142 86 AROM heelslide sitting AA: 90  A:95 P:99  Standing //bar P: 113  Supine: A: 102 P:110 129/138  Knee extension P: -6*  0/0    Seated A: LAQ -6* Supine:A: 0 0/0  Ankle dorsiflexion           Ankle plantarflexion           Ankle inversion           Ankle eversion            (Blank rows = not tested)  LOWER EXTREMITY MMT:  MMT Left eval Left 06/02/22  Hip flexion    Hip extension    Hip abduction    Hip adduction    Hip internal rotation    Hip external rotation    Knee flexion 3-/5 4  Knee extension 3-/5 4  Ankle dorsiflexion    Ankle plantarflexion    Ankle inversion    Ankle eversion     (Blank rows = not tested)  FUNCTIONAL TESTS:  18 inch chair transfer: Modified independent utilizing bilateral upper extremities with no weight on left lower extremity.   GAIT: 06/15/2023: -pt able to neg ramp & curb without AD and no knee instability noted.   Eval:  Distance walked: 70ft x2 Assistive  device utilized: Crutches without brace then without crutches for second bout of ambulation Level of assistance: SBA / verbal cues Comments: Nonweightbearing left lower extremity  TODAY'S TREATMENT                                                                          DATE:  07/02/2023 Therapeutic Exercises: Recumbent bike Seat 8 :20 gentle pedal followed by :10 sprint, Level 8 for 3 minutes (warm-up)  Bridges with hamstrings curls on Swiss Ball 15 x 5 seconds  Functional Activities: Ball squats with mini-mounce (5 mini-bounces for squatting at work for packages/lifting) 15 x [employer] squats with 20# 10 x each flat surface and incline slow eccentrics Golfer's lift (for work) with [employer] bell 5 x left & 5 x right  Neuromuscular re-education: Bil leg jump on leg press 75# 15 x Jump right, land left 15 x 37# Jump left, land right 15 x 50# Single-leg balance with basketball toss 3 minutes Y Balance with cone touch, stand left and reach right 10 x   06/30/2023 Therex: -Recumbent bike L6 X 8 min   Theract: -Kettle bell squats 20lb x15; PT  -Ramp squats 20lb standing uphill/downhill x5ea; vc for proper form and alignment  Neuromuscular re-education: -Y balance, standing LLE on foam beam and reaching right x10; intermittent touch //bars -BAPS board level 2 CW/CCW x5ea heavy BUE support //bars -Single leg balance on foam while tossing basketball with Green TB TKE resistance applied at varied planes x15 toses 2x -Lateral step up on BOSU (round side up) with reach across ant and post UE assist on //bar intermittently -Leg press jumps 75lb BLEs lift & land 15 reps;  single leg lands (BLE jump to LLE land) 50lb x15ea;   alternating SL jump land (R jump to L land then alternate); vc for controlled landing 37# 15 reps   06/28/2023 Therex: -Recumbent bike L8 X 6 min   Theract: -Kettle bell squats 15lb x15 -Ramp squats 15lb standing uphill/downhill x5ea; vc for proper form and  alignment -stairs alternating pattern no rails but slow cautious speed -jumping off last step landing on BLEs with knees flexed 10 reps -side to side jumping 30 sec 2 sets 30 sec rests -forward / backward jumping 30 sec 2 sets 30 sec rests  Neuromuscular re-education: -Y balance, standing left and reaching right x10; vc and demo for proper weight shift and knee bend for anterior push -Single leg balance on foam while tossing basketball with Green TB TKE resistance applied at varied planes x15 toses 2x -Lateral step up on bosu (round side up) with reach across ant and post UE assist on //bar intermittently -Leg press jumps 75lb BLE, single leg lands (BLE jump to LLE land) 50lb x15ea, alternating SL jump land (R jump to L land then alternate); vc for controlled landing 37# 10 reps -BAPS board level 3 CW/CCW x5ea then level 2 CW/CCW x5ea; light to heavy UE hold on rail for support once fatigued   PATIENT EDUCATION:  Education details: HEP, POC Person educated: Patient Education method: Programmer, multimedia, Demonstration, Verbal cues, and Handouts Education comprehension: verbalized understanding, returned demonstration, and verbal cues required  HOME EXERCISE PROGRAM: Access Code: [email or web address] URL: [email or web address] Date: 05/31/2023 Prepared by: Vladimir Faster  Exercises - Standing Quad Set  - 3-4 x daily - 7 x weekly - 2-3 sets - 10 reps - 5 seconds hold - Seated Quad Set  - 3-4 x daily - 7 x weekly - 2-3 sets - 10 reps - 5 seconds hold - Ankle Alphabet in Elevation  - 2 x daily - 7 x weekly - 1 sets - >15 min hold - Seated Hamstring Set  - 3 x daily - 7 x weekly - 10 reps - 5 seconds hold - Seated Heel Slide  - 3 x daily - 7 x  weekly - 10 reps - 5 seconds hold - standing knee extension  - 1 x daily - 7 x weekly - 4 sets - 30-15 reps - 5 seconds hold   ASSESSMENT: CLINICAL IMPRESSION: Jesse Howe looks better each week I see him.  He is close to being ready to go back to work.   Pain is not concerning to him, although he is worried about fatiguing quickly during his 5+ hour shift at [employer] where he is hustling the entire time.  Continue strength, endurance, proprioceptive work to prepare for DC and return to work.  OBJECTIVE IMPAIRMENTS: Abnormal gait, decreased activity tolerance, decreased balance, decreased knowledge of use of DME, decreased mobility, difficulty walking, decreased ROM, decreased strength, increased edema, increased muscle spasms, and pain.   ACTIVITY LIMITATIONS: carrying, lifting, bending, standing, squatting, stairs, transfers, and locomotion level  PARTICIPATION LIMITATIONS: meal prep, cleaning, laundry, community activity, and occupation  PERSONAL FACTORS: 1-2 comorbidities: see PMH  are also affecting patient's functional outcome.   REHAB POTENTIAL: Good  CLINICAL DECISION MAKING: Stable/uncomplicated  EVALUATION COMPLEXITY: Low   GOALS: Goals reviewed with patient? Yes  SHORT TERM GOALS: (target date for Short term goals 06/02/2022)   1.  Patient will demonstrate independent use of home exercise program to maintain progress from in clinic treatments. Baseline: See objective data Goal status: Met 05/14/2023  2. PROM left knee 0* ext to 100* flexion Baseline: See objective data Goal status: Met 06/25/2023  3. Interim FOTO 50% Baseline: See objective data Goal status:  MET  06/07/2023  LONG TERM GOALS: (target dates for all long term goals  07/08/2022 )   1. Patient will demonstrate/report pain at worst less than or equal to 2/10 to facilitate minimal limitation in daily activity secondary to pain symptoms. Baseline: See objective data Goal status: Partially Met 07/02/2023   2. Patient will demonstrate independent use of home exercise program to facilitate ability to maintain/progress functional gains from skilled physical therapy services. Baseline: See objective data Goal status: Ongoing 07/02/2023   3. Patient will demonstrate FOTO  outcome > or = 69 % to indicate reduced disability due to condition. Baseline: See objective data Goal status: On Going 07/02/2023   4.  Patient will demonstrate left LE MMT 5/5 throughout to faciltiate usual transfers, stairs, squatting at [facility] for daily life.  Baseline: See objective data Goal status: On Going 07/02/2023   5.  Patient ambulates >500' without device and negotiates ramps, curbs and stairs single rail alternating pattern modified independent.  Baseline: See objective data Goal status: MET 06/15/2023   6.  Left knee AROM 0* - 105* Baseline: See objective data Goal status: Met 06/25/2023   7.  patient demonstrates ability to safely perform work related tasks.  Baseline: See objective data Goal status:   On Going 07/02/2023  PLAN:  PT FREQUENCY:  2-3x/week  PT DURATION: 10 weeks  PLANNED INTERVENTIONS: Therapeutic exercises, Therapeutic activity, Neuro Muscular re-education, Balance training, Gait training, Patient/Family education, Joint mobilization, Stair training, DME instructions, Dry Needling, Electrical stimulation, Traction, Cryotherapy, vasopneumatic deviceMoist heat, Taping, Ultrasound, Ionotophoresis 4mg /ml Dexamethasone, and aquatic therapy, Manual therapy.  All included unless contraindicated  PLAN FOR NEXT SESSION:  Continue plyometrics, TKE with resistance in varied planes while balancing, increase wt or reps for kettle bell squats (level ground and ramp), endurance with standing, quadriceps strength.   Cherlyn Cushing, PT, MPT 07/02/2023, 3:57 PM

## 2023-07-05 ENCOUNTER — Encounter: Payer: Self-pay | Admitting: Physical Therapy

## 2023-07-05 ENCOUNTER — Ambulatory Visit (INDEPENDENT_AMBULATORY_CARE_PROVIDER_SITE_OTHER): Payer: MEDICAID | Admitting: Physical Therapy

## 2023-07-05 DIAGNOSIS — R600 Localized edema: Secondary | ICD-10-CM

## 2023-07-05 DIAGNOSIS — R2689 Other abnormalities of gait and mobility: Secondary | ICD-10-CM

## 2023-07-05 DIAGNOSIS — M6281 Muscle weakness (generalized): Secondary | ICD-10-CM

## 2023-07-05 DIAGNOSIS — M25562 Pain in left knee: Secondary | ICD-10-CM

## 2023-07-05 DIAGNOSIS — M25662 Stiffness of left knee, not elsewhere classified: Secondary | ICD-10-CM

## 2023-07-05 NOTE — Therapy (Addendum)
 OUTPATIENT PHYSICAL THERAPY TREATMENT NOTE  Patient Name: Jesse Howe MRN: [id number] DOB:February 13, 2006, 18 y.o., male Today's Date: 07/05/2023  END OF SESSION:   07/05/23 1346  PT Visits / Re-Eval  Visit Number 22  Number of Visits 25  Date for PT Re-Evaluation 07/09/23  Authorization  Authorization Type [employer] & Medicaid  Progress Note Due on Visit 24  PT Time Calculation  PT Start Time 1346  PT Stop Time 1429  PT Time Calculation (min) 43 min  PT - End of Session  Activity Tolerance Patient tolerated treatment well;No increased pain  Behavior During Therapy [facility] for tasks assessed/performed        Past Medical History:  Diagnosis Date   Allergy    RHINITIS   Asperger syndrome    sees psychiatry and counseling   Asthma    [employer] 08/16/2020   Developmental disorder    Insomnia    Past Surgical History:  Procedure Laterality Date   ANTERIOR CRUCIATE LIGAMENT REPAIR Right 04/07/2021   Procedure: RIGHT KNEE RECONSTRUCTION ANTERIOR CRUCIATE LIGAMENT (ACL) WITH QUADRICEPS AUTOGRAFT, LATERAL MENISCAL DEBRIDEMENT;  Surgeon: Cammy Copa, MD;  Location: [facility];  Service: Orthopedics;  Laterality: Right;   ANTERIOR CRUCIATE LIGAMENT REPAIR Left 04/19/2023   Procedure: LEFT KNEE ARTHROSCOPY ANTERIOR CRUCIATE LIGAMENT (ACL) RECONSTRUCTION WITH QUADRICEP GRAFT;  Surgeon: Cammy Copa, MD;  Location: [facility];  Service: Orthopedics;  Laterality: Left;   TONSILLECTOMY AND ADENOIDECTOMY Bilateral March 2011   Patient Active Problem List   Diagnosis Date Noted   Left anterior cruciate ligament tear 04/25/2023   Rupture of anterior cruciate ligament of right knee    Acute lateral meniscus tear of right knee    Complete tear of right ACL, initial encounter 03/18/2021   Acute medial meniscus tear of right knee 03/18/2021   Abnormal finding on EKG 06/19/2019   Need for prophylactic vaccination and inoculation against influenza 08/09/2018   Depression  in pediatric patient 08/06/2018   Rhinitis, allergic 02/21/2015   Asthma, moderate persistent 02/21/2015   Encounter for follow-up 02/21/2015   Need for HPV vaccination 02/21/2015   Vaccine counseling 02/21/2015   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   PERTINENT HISTORY: Left ACL reconstruction with quad autograft 04/19/23, right ACL reconstruction with quad autograft 04/07/21, asthma, 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, requiring support, developmental disorder  PAIN:  NPRS scale: 0-2/10 this week Pain location: Left knee inside joint Pain description: Achy Aggravating factors: Too much WB, prolonged knee flexion, like in a car Relieving factors: CBD gummie (only 1 this week)  PRECAUTIONS: Knee  WEIGHT BEARING RESTRICTIONS: Yes LLE WBAT  FALLS:  Has patient fallen in last 6 months? Only with initial injury  LIVING ENVIRONMENT: Lives with: lives with their family and 2 dogs ~100#   Lives in: [place] with basement (his bedroom) but staying upstairs for now. Stairs: Yes: Internal: 14 steps; on right going  up and ramp at main entrance Has following equipment at home: Crutches and Bledsoe brace  OCCUPATION: works at [employer] - lift up to 75#, on feet for 5 hours  PLOF: Independent  PATIENT GOALS:   return to work,  function in community.  Next MD visit:  05/24/2023  OBJECTIVE:  DIAGNOSTIC FINDINGS: MRI 03/29/23: impression - 1. Complete ACL tear. 2. No meniscal tear of the left knee. 3. Osseous contusions of the posterolateral tibial plateau, posteromedial tibial plateau and peripheral corner of the medial femoral condyle. 4. Large joint effusion.  PATIENT SURVEYS:  07/02/2023 FOTO 57  06/07/2023 FOTO visit 12  55%  Eval:  FOTO intake:  43%  predicted:  69%  COGNITION: Overall cognitive status: WFL    SENSATION: WFL  EDEMA:  Left knee & calf edematous.    PALPATION: Tenderness to touch along entire joint line, patella tendon and quadriceps tendon.  LOWER EXTREMITY ROM:   ROM Left eval Left 05/06/23 Left/Right Active 05/14/2023 Left 05/19/23 Left 05/24/23 Left 06/03/23 Left 06/07/23 Left 06/15/23 Left/Right 06/25/23  Hip flexion           Hip extension           Hip abduction           Hip adduction           Hip internal rotation           Hip external rotation           Knee flexion P: 72* P: 85 78/142 86 AROM heelslide sitting AA: 90  A:95 P:99  Standing //bar P: 113  Supine: A: 102 P:110 129/138  Knee extension P: -6*  0/0    Seated A: LAQ -6* Supine:A: 0 0/0  Ankle dorsiflexion           Ankle plantarflexion           Ankle inversion           Ankle eversion            (Blank rows = not tested)  LOWER EXTREMITY MMT:  MMT Left eval Left 06/02/22  Hip flexion    Hip extension    Hip abduction    Hip adduction    Hip internal rotation    Hip external rotation    Knee flexion 3-/5 4  Knee extension 3-/5 4  Ankle dorsiflexion    Ankle plantarflexion    Ankle inversion    Ankle eversion     (Blank rows = not tested)  FUNCTIONAL TESTS:  18 inch chair transfer: Modified independent utilizing bilateral upper extremities with no weight on left lower extremity.   GAIT: 06/15/2023: -pt able to neg ramp & curb without AD and no knee instability  noted.   Eval:  Distance walked: 64ft x2 Assistive device utilized: Crutches without brace then without crutches for second bout of ambulation Level of assistance: SBA / verbal cues Comments: Nonweightbearing left lower extremity  TODAY'S TREATMENT                                                                          DATE:   07/05/2023 Therapeutic Exercise: Recumbent bike Seat 8 level 8 with 4  minute warm-up then 0:15 sec gentle pedal followed by 0:10 sec sprint, 8 min total.   Therapeutic Activities: -PT verbally educated on building up to work tolerance by setting timer for 1 hour.  Work around house or exercise for 20 min, then rest 40 min for 5 hours (lower of his work time).  Build up to work time similar to work including hours.  Pt verbalized understanding including recommendation to try tomorrow.   -Kettle bell squats with 20# 10 x each flat surface and incline up hill & down hill slow eccentrics -Golfer's lift (for work) with [employer] bell 5 x left & 5 x right -walking 50' X 2 carrying 18# box. -moving 18# box 90* between 2 chairs 18" high 10 reps from upper handles and 10 reps from lower handle. -standing midway between 2 18" chairs ~60* angle - moving 18# box between chairs with wide stationary stance.   Neuromuscular Re-education: -Bil leg jump on leg press 75# 15 x -Jump right, land left 15 x 37# -Jump left, land right 15 x 50#   TREATMENT                                                                          DATE:   07/02/2023 Therapeutic Exercises: Recumbent bike Seat 8 :20 gentle pedal followed by :10 sprint, Level 8 for 3 minutes (warm-up)  Bridges with hamstrings curls on Swiss Ball 15 x 5 seconds  Functional Activities: Ball squats with mini-mounce (5 mini-bounces for squatting at work for packages/lifting) 15 x [employer] squats with 20# 10 x each flat surface and incline slow eccentrics Golfer's lift (for work) with [employer] bell 5 x left & 5 x  right  Neuromuscular re-education: Bil leg jump on leg press 75# 15 x Jump right, land left 15 x 37# Jump left, land right 15 x 50# Single-leg balance with basketball toss 3 minutes Y Balance with cone touch, stand left and reach right 10 x   06/30/2023 Therex: -Recumbent bike L6 X 8 min   Theract: -Kettle bell squats 20lb x15; PT  -Ramp squats 20lb standing uphill/downhill x5ea; vc for proper form and alignment  Neuromuscular re-education: -Y balance, standing LLE on foam beam and reaching right x10; intermittent touch //bars -BAPS board level 2 CW/CCW x5ea heavy BUE support //bars -Single leg balance on foam while tossing basketball with Green TB TKE resistance applied at varied planes x15 toses 2x -Lateral step up on BOSU (round side up) with reach across ant and post UE assist on //bar intermittently -Leg press jumps 75lb BLEs lift & land 15 reps;  single leg lands (BLE jump to LLE land) 50lb x15ea;   alternating SL jump land (R jump to L land then alternate); vc for controlled landing 37# 15 reps    PATIENT EDUCATION:  Education details: HEP, POC Person educated: Patient Education method: Programmer, multimedia, Demonstration, Verbal cues, and Handouts Education comprehension: verbalized understanding, returned demonstration, and verbal cues required  HOME EXERCISE PROGRAM: Access Code: [email or web address] URL: [email or web address] Date: 05/31/2023 Prepared by: Vladimir Faster  Exercises - Standing Quad Set  - 3-4 x daily - 7 x weekly - 2-3 sets - 10 reps - 5 seconds hold - Seated Quad Set  -  3-4 x daily - 7 x weekly - 2-3 sets - 10 reps - 5 seconds hold - Ankle Alphabet in Elevation  - 2 x daily - 7 x weekly - 1 sets - >15 min hold - Seated Hamstring Set  - 3 x daily - 7 x weekly - 10 reps - 5 seconds hold - Seated Heel Slide  - 3 x daily - 7 x weekly - 10 reps - 5 seconds hold - standing knee extension  - 1 x daily - 7 x weekly - 4 sets - 30-15 reps - 5 seconds  hold   ASSESSMENT: CLINICAL IMPRESSION: PT continues to include work simulations tasks in his conditioning work.  PT advised how to build tolerance to work time which he appears to understand.  Patient's FOTO score continues to be lower than his target due to running restrictions.   OBJECTIVE IMPAIRMENTS: Abnormal gait, decreased activity tolerance, decreased balance, decreased knowledge of use of DME, decreased mobility, difficulty walking, decreased ROM, decreased strength, increased edema, increased muscle spasms, and pain.   ACTIVITY LIMITATIONS: carrying, lifting, bending, standing, squatting, stairs, transfers, and locomotion level  PARTICIPATION LIMITATIONS: meal prep, cleaning, laundry, community activity, and occupation  PERSONAL FACTORS: 1-2 comorbidities: see PMH  are also affecting patient's functional outcome.   REHAB POTENTIAL: Good  CLINICAL DECISION MAKING: Stable/uncomplicated  EVALUATION COMPLEXITY: Low   GOALS: Goals reviewed with patient? Yes  SHORT TERM GOALS: (target date for Short term goals 06/02/2022)   1.  Patient will demonstrate independent use of home exercise program to maintain progress from in clinic treatments. Baseline: See objective data Goal status: Met 05/14/2023  2. PROM left knee 0* ext to 100* flexion Baseline: See objective data Goal status: Met 06/25/2023  3. Interim FOTO 50% Baseline: See objective data Goal status:  MET  06/07/2023  LONG TERM GOALS: (target dates for all long term goals  07/08/2022 )   1. Patient will demonstrate/report pain at worst less than or equal to 2/10 to facilitate minimal limitation in daily activity secondary to pain symptoms. Baseline: See objective data Goal status: Partially Met 07/02/2023   2. Patient will demonstrate independent use of home exercise program to facilitate ability to maintain/progress functional gains from skilled physical therapy services. Baseline: See objective data Goal status:  Ongoing 07/02/2023   3. Patient will demonstrate FOTO outcome > or = 69 % to indicate reduced disability due to condition. Baseline: See objective data Goal status: On Going 07/02/2023   4.  Patient will demonstrate left LE MMT 5/5 throughout to faciltiate usual transfers, stairs, squatting at [facility] for daily life.  Baseline: See objective data Goal status: On Going 07/02/2023   5.  Patient ambulates >500' without device and negotiates ramps, curbs and stairs single rail alternating pattern modified independent.  Baseline: See objective data Goal status: MET 06/15/2023   6.  Left knee AROM 0* - 105* Baseline: See objective data Goal status: Met 06/25/2023   7.  patient demonstrates ability to safely perform work related tasks.  Baseline: See objective data Goal status:   On Going 07/02/2023  PLAN:  PT FREQUENCY:  2-3x/week  PT DURATION: 10 weeks  PLANNED INTERVENTIONS: Therapeutic exercises, Therapeutic activity, Neuro Muscular re-education, Balance training, Gait training, Patient/Family education, Joint mobilization, Stair training, DME instructions, Dry Needling, Electrical stimulation, Traction, Cryotherapy, vasopneumatic deviceMoist heat, Taping, Ultrasound, Ionotophoresis 4mg /ml Dexamethasone, and aquatic therapy, Manual therapy.  All included unless contraindicated  PLAN FOR NEXT SESSION:  check how work 20 of 60  minutes for each hour for 5 hours went.   Continue plyometrics, TKE with resistance in varied planes while balancing, increase wt or reps for kettle bell squats (level ground and ramp), endurance with standing, quadriceps strength.   Vladimir Faster, PT, DPT 07/05/2023, 2:27 PM

## 2023-07-07 ENCOUNTER — Encounter: Payer: Self-pay | Admitting: Physical Therapy

## 2023-07-07 ENCOUNTER — Ambulatory Visit (INDEPENDENT_AMBULATORY_CARE_PROVIDER_SITE_OTHER): Payer: MEDICAID | Admitting: Physical Therapy

## 2023-07-07 DIAGNOSIS — M25662 Stiffness of left knee, not elsewhere classified: Secondary | ICD-10-CM

## 2023-07-07 DIAGNOSIS — M6281 Muscle weakness (generalized): Secondary | ICD-10-CM

## 2023-07-07 DIAGNOSIS — R2689 Other abnormalities of gait and mobility: Secondary | ICD-10-CM

## 2023-07-07 DIAGNOSIS — R600 Localized edema: Secondary | ICD-10-CM

## 2023-07-07 DIAGNOSIS — M25562 Pain in left knee: Secondary | ICD-10-CM | POA: Diagnosis not present

## 2023-07-07 NOTE — Therapy (Signed)
 OUTPATIENT PHYSICAL THERAPY TREATMENT NOTE  Patient Name: Jesse Howe MRN: [id number] DOB:2006/05/17, 18 y.o., male Today's Date: 07/07/2023  END OF SESSION:  PT End of Session - 07/07/23 1434     Visit Number 23    Number of Visits 25    Date for PT Re-Evaluation 07/09/23    Authorization Type BCBS & Medicaid    Progress Note Due on Visit 24    PT Start Time 1431    PT Stop Time 1511    PT Time Calculation (min) 40 min    Activity Tolerance Patient tolerated treatment well;No increased pain    Behavior During Therapy [facility] for tasks assessed/performed                  Past Medical History:  Diagnosis Date   Allergy    RHINITIS   Asperger syndrome    sees psychiatry and counseling   Asthma    [employer] 08/16/2020   Developmental disorder    Insomnia    Past Surgical History:  Procedure Laterality Date   ANTERIOR CRUCIATE LIGAMENT REPAIR Right 04/07/2021   Procedure: RIGHT KNEE RECONSTRUCTION ANTERIOR CRUCIATE LIGAMENT (ACL) WITH QUADRICEPS AUTOGRAFT, LATERAL MENISCAL DEBRIDEMENT;  Surgeon: Addie Cordella Hamilton, MD;  Location: [facility];  Service: Orthopedics;  Laterality: Right;   ANTERIOR CRUCIATE LIGAMENT REPAIR Left 04/19/2023   Procedure: LEFT KNEE ARTHROSCOPY ANTERIOR CRUCIATE LIGAMENT (ACL) RECONSTRUCTION WITH QUADRICEP GRAFT;  Surgeon: Addie Cordella Hamilton, MD;  Location: [facility];  Service: Orthopedics;  Laterality: Left;   TONSILLECTOMY AND ADENOIDECTOMY Bilateral March 2011   Patient Active Problem List   Diagnosis Date Noted   Left anterior cruciate ligament tear 04/25/2023   Rupture of anterior cruciate ligament of right knee    Acute lateral meniscus tear of right knee    Complete tear of right ACL, initial encounter 03/18/2021   Acute medial meniscus tear of right knee 03/18/2021   Abnormal finding on EKG 06/19/2019   Need for prophylactic vaccination and inoculation against influenza 08/09/2018   Depression in pediatric patient  08/06/2018   Rhinitis, allergic 02/21/2015   Asthma, moderate persistent 02/21/2015   Encounter for follow-up 02/21/2015   Need for HPV vaccination 02/21/2015   Vaccine counseling 02/21/2015   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, requiring support (level 1) 12/07/2013   Developmental disorder 05/11/2013    PCP: [name], MD  REFERRING PROVIDER: Addie Cordella Hamilton, MD  REFERRING DIAG: [phone] (ICD-10-CM) - S/P ACL reconstruction   THERAPY DIAG:  Muscle weakness (generalized)  Acute pain of left knee  Stiffness of left knee, not elsewhere classified  Other abnormalities of gait and mobility  Localized edema  Rationale for Evaluation and Treatment: Rehabilitation  ONSET DATE: 04/19/2023 Left knee ACL arthroscopy  SUBJECTIVE:   SUBJECTIVE STATEMENT: He worked 20 min, rest 40 min but only for 3 hours. His legs were sore.  Patient reports that he feels like he needs more PT to enable enough strength & endurance to return to work.    PERTINENT HISTORY: Left ACL reconstruction with quad autograft 04/19/23, right ACL reconstruction with quad autograft 04/07/21, asthma, 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, requiring support, developmental disorder  PAIN:  NPRS scale:  0-2/10 this week Pain location: Left knee inside joint Pain description: Achy Aggravating factors: Too much WB, prolonged knee flexion, like in a car Relieving factors: CBD gummie (only 1 this week)  PRECAUTIONS: Knee  WEIGHT BEARING RESTRICTIONS: Yes LLE WBAT  FALLS:  Has patient fallen in last 6 months? Only with initial injury  LIVING ENVIRONMENT: Lives with: lives with their family and 2 dogs ~100#   Lives in: [place] with basement (his bedroom) but staying upstairs for now. Stairs: Yes: Internal: 14 steps; on right going up and ramp at main entrance Has following equipment at home: Crutches and Bledsoe brace  OCCUPATION:  works at [employer] - lift up to 75#, on feet for 5 hours  PLOF: Independent  PATIENT GOALS:  return to work,  function in community.  Next MD visit:  05/24/2023  OBJECTIVE:  DIAGNOSTIC FINDINGS: MRI 03/29/23: impression - 1. Complete ACL tear. 2. No meniscal tear of the left knee. 3. Osseous contusions of the posterolateral tibial plateau, posteromedial tibial plateau and peripheral corner of the medial femoral condyle. 4. Large joint effusion.  PATIENT SURVEYS:  07/02/2023 FOTO 57  06/07/2023 FOTO visit 12  55%  Eval:  FOTO intake:  43%  predicted:  69%  COGNITION: Overall cognitive status: WFL    SENSATION: WFL  EDEMA:  Left knee & calf edematous.    PALPATION: Tenderness to touch along entire joint line, patella tendon and quadriceps tendon.  LOWER EXTREMITY ROM:   ROM Left eval Left 05/06/23 Left/Right Active 05/14/2023 Left 05/19/23 Left 05/24/23 Left 06/03/23 Left 06/07/23 Left 06/15/23 Left/Right 06/25/23  Hip flexion           Hip extension           Hip abduction           Hip adduction           Hip internal rotation           Hip external rotation           Knee flexion P: 72* P: 85 78/142 86 AROM heelslide sitting AA: 90  A:95 P:99  Standing //bar P: 113  Supine: A: 102 P:110 129/138  Knee extension P: -6*  0/0    Seated A: LAQ -6* Supine:A: 0 0/0  Ankle dorsiflexion           Ankle plantarflexion           Ankle inversion           Ankle eversion            (Blank rows = not tested)  LOWER EXTREMITY MMT:  MMT Left eval Left 06/02/22  Hip flexion    Hip extension    Hip abduction    Hip adduction    Hip internal rotation    Hip external rotation    Knee flexion 3-/5 4  Knee extension 3-/5 4  Ankle dorsiflexion    Ankle plantarflexion    Ankle inversion    Ankle eversion     (Blank rows = not tested)  FUNCTIONAL TESTS:  18 inch chair transfer: Modified independent utilizing bilateral upper extremities with no weight on left  lower extremity.   GAIT: 06/15/2023: -pt able to neg ramp & curb without AD and no knee instability noted.   Eval:  Distance walked: 92ft x2 Assistive device utilized: Crutches without brace then without crutches for second bout of ambulation Level of assistance: SBA / verbal cues Comments: Nonweightbearing left lower extremity  TODAY'S TREATMENT  DATE:   07/07/2023  Therapeutic Exercise: Recumbent bike Seat 8 level 8 with 3 minute warm-up then 0:15 sec gentle pedal followed by 0:15 sec sprint, 5 min for 8 min total.   Therapeutic Activities: -Kettle bell squats with 20# 10 x each flat surface and incline up hill & down hill slow eccentrics -Golfer's lift (for work) with [employer] bell 5 x left & 5 x right -walking 60' X 2 carrying 25# box. -moving 25# box 90* between 2 chairs 18 high 10 reps from upper handles and 10 reps from lower handle. -standing midway between 2 18 chairs ~60* angle - moving 25# box between chairs with wide stationary stance 10 reps from upper handles and 10 reps from lower handle. -stairs alternating pattern 11 steps 1st flight light rail support and 2nd flight no rail -power push / pull with pulley weights 50# (25# per stack) walking out / back with weight behind 5 reps & with weight in front 5 reps -power lateral 25# with BUEs on pulley side stepping 5 reps with weight to right and 5 reps with weight to left -PT verbally reviewed building up to work tolerance by setting timer for 1 hour.  Work around house or exercise for 20 min, then rest 40 min for 5 hours (lower of his work time).  Build up to work time similar to work including hours. Some activities may less intense light sweeping but his is on his feet.  Pt verbalized understanding.   Neuromuscular Re-education: -Bil leg jump on leg press 81# 15 x -Jump right, land left 15 x 43# -Jump left, land right 15 x 50#   TREATMENT                                                                           DATE:   07/05/2023 Therapeutic Exercise: Recumbent bike Seat 8 level 8 with 4 minute warm-up then 0:15 sec gentle pedal followed by 0:15 sec sprint, 8 min total.   Therapeutic Activities: -PT verbally educated on building up to work tolerance by setting timer for 1 hour.  Work around house or exercise for 20 min, then rest 40 min for 5 hours (lower of his work time).  Build up to work time similar to work including hours.  Pt verbalized understanding including recommendation to try tomorrow.   -Kettle bell squats with 20# 10 x each flat surface and incline up hill & down hill slow eccentrics -Golfer's lift (for work) with [employer] bell 5 x left & 5 x right -walking 50' X 2 carrying 18# box. -moving 18# box 90* between 2 chairs 18 high 10 reps from upper handles and 10 reps from lower handle. -standing midway between 2 18 chairs ~60* angle - moving 18# box between chairs with wide stationary stance 10 reps.   Neuromuscular Re-education: -Bil leg jump on leg press 75# 15 x -Jump right, land left 15 x 37# -Jump left, land right 15 x 50#   TREATMENT  DATE:   07/02/2023 Therapeutic Exercises: Recumbent bike Seat 8 :20 gentle pedal followed by :10 sprint, Level 8 for 3 minutes (warm-up)  Bridges with hamstrings curls on Swiss Ball 15 x 5 seconds  Functional Activities: Ball squats with mini-mounce (5 mini-bounces for squatting at work for packages/lifting) 15 x Fpl group squats with 20# 10 x each flat surface and incline slow eccentrics Golfer's lift (for work) with [employer] bell 5 x left & 5 x right  Neuromuscular re-education: Bil leg jump on leg press 75# 15 x Jump right, land left 15 x 37# Jump left, land right 15 x 50# Single-leg balance with basketball toss 3 minutes Y Balance with cone touch, stand left and reach right 10 x    PATIENT  EDUCATION:  Education details: HEP, POC Person educated: Patient Education method: Programmer, Multimedia, Demonstration, Verbal cues, and Handouts Education comprehension: verbalized understanding, returned demonstration, and verbal cues required  HOME EXERCISE PROGRAM: Access Code: [email or web address] URL: [email or web address] Date: 05/31/2023 Prepared by: Grayce Spatz  Exercises - Standing Quad Set  - 3-4 x daily - 7 x weekly - 2-3 sets - 10 reps - 5 seconds hold - Seated Quad Set  - 3-4 x daily - 7 x weekly - 2-3 sets - 10 reps - 5 seconds hold - Ankle Alphabet in Elevation  - 2 x daily - 7 x weekly - 1 sets - >15 min hold - Seated Hamstring Set  - 3 x daily - 7 x weekly - 10 reps - 5 seconds hold - Seated Heel Slide  - 3 x daily - 7 x weekly - 10 reps - 5 seconds hold - standing knee extension  - 1 x daily - 7 x weekly - 4 sets - 30-15 reps - 5 seconds hold   ASSESSMENT: CLINICAL IMPRESSION: Patient may need further skilled PT to progress power, strength and muscle endurance.  He would probably benefit from 1x/wk to advise or educate on progression of exercises and activities.  Patient's FOTO score continues to be lower than his target due to running restrictions.   OBJECTIVE IMPAIRMENTS: Abnormal gait, decreased activity tolerance, decreased balance, decreased knowledge of use of DME, decreased mobility, difficulty walking, decreased ROM, decreased strength, increased edema, increased muscle spasms, and pain.   ACTIVITY LIMITATIONS: carrying, lifting, bending, standing, squatting, stairs, transfers, and locomotion level  PARTICIPATION LIMITATIONS: meal prep, cleaning, laundry, community activity, and occupation  PERSONAL FACTORS: 1-2 comorbidities: see PMH  are also affecting patient's functional outcome.   REHAB POTENTIAL: Good  CLINICAL DECISION MAKING: Stable/uncomplicated  EVALUATION COMPLEXITY: Low   GOALS: Goals reviewed with patient? Yes  SHORT TERM GOALS: (target  date for Short term goals 06/02/2022)   1.  Patient will demonstrate independent use of home exercise program to maintain progress from in clinic treatments. Baseline: See objective data Goal status: Met 05/14/2023  2. PROM left knee 0* ext to 100* flexion Baseline: See objective data Goal status: Met 06/25/2023  3. Interim FOTO 50% Baseline: See objective data Goal status:  MET  06/07/2023  LONG TERM GOALS: (target dates for all long term goals  07/08/2022 )   1. Patient will demonstrate/report pain at worst less than or equal to 2/10 to facilitate minimal limitation in daily activity secondary to pain symptoms. Baseline: See objective data Goal status: Partially Met 07/02/2023   2. Patient will demonstrate independent use of home exercise program to facilitate ability to maintain/progress functional gains from skilled physical therapy services. Baseline: See objective data Goal status:  Ongoing 07/02/2023   3. Patient will demonstrate FOTO outcome > or = 69 % to indicate reduced disability due to condition. Baseline: See objective data Goal status: On Going 07/02/2023   4.  Patient will demonstrate left LE MMT 5/5 throughout to faciltiate usual transfers, stairs, squatting at [facility] for daily life.  Baseline: See objective data Goal status: On Going 07/02/2023   5.  Patient ambulates >500' without device and negotiates ramps, curbs and stairs single rail alternating pattern modified independent.  Baseline: See objective data Goal status: MET 06/15/2023   6.  Left knee AROM 0* - 105* Baseline: See objective data Goal status: Met 06/25/2023   7.  patient demonstrates ability to safely perform work related tasks.  Baseline: See objective data Goal status:   On Going 07/02/2023  PLAN:  PT FREQUENCY:  2-3x/week  PT DURATION: 10 weeks  PLANNED INTERVENTIONS: Therapeutic exercises, Therapeutic activity, Neuro Muscular re-education, Balance training, Gait training, Patient/Family  education, Joint mobilization, Stair training, DME instructions, Dry Needling, Electrical stimulation, Traction, Cryotherapy, vasopneumatic deviceMoist heat, Taping, Ultrasound, Ionotophoresis 4mg /ml Dexamethasone , and aquatic therapy, Manual therapy.  All included unless contraindicated  PLAN FOR NEXT SESSION: check LTGs with probable recertification at 1x/wk, check how work 20 of 60 minutes for each hour for 5 hours went.   Continue plyometrics, TKE with resistance in varied planes while balancing, increase wt or reps for kettle bell squats (level ground and ramp), endurance with standing, quadriceps strength.   Ebenezer Mccaskey, PT, DPT 07/07/2023, 3:21 PM

## 2023-07-09 ENCOUNTER — Ambulatory Visit (INDEPENDENT_AMBULATORY_CARE_PROVIDER_SITE_OTHER): Payer: MEDICAID | Admitting: Rehabilitative and Restorative Service Providers"

## 2023-07-09 ENCOUNTER — Encounter: Payer: Self-pay | Admitting: Rehabilitative and Restorative Service Providers"

## 2023-07-09 DIAGNOSIS — M25562 Pain in left knee: Secondary | ICD-10-CM | POA: Diagnosis not present

## 2023-07-09 DIAGNOSIS — M25662 Stiffness of left knee, not elsewhere classified: Secondary | ICD-10-CM | POA: Diagnosis not present

## 2023-07-09 DIAGNOSIS — R2689 Other abnormalities of gait and mobility: Secondary | ICD-10-CM

## 2023-07-09 DIAGNOSIS — M6281 Muscle weakness (generalized): Secondary | ICD-10-CM | POA: Diagnosis not present

## 2023-07-09 DIAGNOSIS — R600 Localized edema: Secondary | ICD-10-CM

## 2023-07-09 NOTE — Therapy (Addendum)
 OUTPATIENT PHYSICAL THERAPY TREATMENT/RE-CERTIFICATION NOTE  Patient Name: Fernandez E Chirino MRN: [id number] DOB:Dec 25, 2005, 18 y.o., male Today's Date: 07/09/2023  END OF SESSION:  PT End of Session - 07/09/23 1512     Visit Number 24    Number of Visits 25    Date for PT Re-Evaluation 08/13/23    Authorization Type BCBS & Medicaid    Progress Note Due on Visit 24    PT Start Time 1433    PT Stop Time 1513    PT Time Calculation (min) 40 min    Activity Tolerance Patient tolerated treatment well;No increased pain    Behavior During Therapy [facility] for tasks assessed/performed            Progress Note Reporting Period 05/04/2024 to 07/09/2023  See note below for Objective Data and Assessment of Progress/Goals.      Past Medical History:  Diagnosis Date   Allergy    RHINITIS   Asperger syndrome    sees psychiatry and counseling   Asthma    Phreesia 08/16/2020   Developmental disorder    Insomnia    Past Surgical History:  Procedure Laterality Date   ANTERIOR CRUCIATE LIGAMENT REPAIR Right 04/07/2021   Procedure: RIGHT KNEE RECONSTRUCTION ANTERIOR CRUCIATE LIGAMENT (ACL) WITH QUADRICEPS AUTOGRAFT, LATERAL MENISCAL DEBRIDEMENT;  Surgeon: Addie Cordella Hamilton, MD;  Location: [facility];  Service: Orthopedics;  Laterality: Right;   ANTERIOR CRUCIATE LIGAMENT REPAIR Left 04/19/2023   Procedure: LEFT KNEE ARTHROSCOPY ANTERIOR CRUCIATE LIGAMENT (ACL) RECONSTRUCTION WITH QUADRICEP GRAFT;  Surgeon: Addie Cordella Hamilton, MD;  Location: [facility];  Service: Orthopedics;  Laterality: Left;   TONSILLECTOMY AND ADENOIDECTOMY Bilateral March 2011   Patient Active Problem List   Diagnosis Date Noted   Left anterior cruciate ligament tear 04/25/2023   Rupture of anterior cruciate ligament of right knee    Acute lateral meniscus tear of right knee    Complete tear of right ACL, initial encounter 03/18/2021   Acute medial meniscus tear of right knee 03/18/2021   Abnormal finding  on EKG 06/19/2019   Need for prophylactic vaccination and inoculation against influenza 08/09/2018   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   Rhinitis, allergic 02/21/2015   Asthma, moderate persistent 02/21/2015   Encounter for follow-up 02/21/2015   Need for HPV vaccination 02/21/2015   Vaccine counseling 02/21/2015   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, requiring support (level 1) 12/07/2013   Developmental disorder 05/11/2013    PCP: [name], MD  REFERRING PROVIDER: Shirly Carlin CROME, PA-C  REFERRING DIAG: [phone] (ICD-10-CM) - S/P ACL reconstruction   THERAPY DIAG:  Muscle weakness (generalized)  Acute pain of left knee  Stiffness of left knee, not elsewhere classified  Other abnormalities of gait and mobility  Localized edema  Rationale for Evaluation and Treatment: Rehabilitation  ONSET DATE: 04/19/2023 Left knee ACL arthroscopy  SUBJECTIVE:   SUBJECTIVE STATEMENT: Undra is working on improving his endurance to work activities at home.  He has progressed up to 20 minutes of work like activities per hour for up to 3 hours.  His typical shifts at [employer] are 5 hours and can be longer.  PERTINENT HISTORY: Left ACL reconstruction with quad autograft 04/19/23, right ACL reconstruction with quad autograft 04/07/21, asthma, 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, requiring support, developmental disorder  PAIN:  NPRS scale:  0-2/10 this week Pain location: Left knee inside joint Pain description: Achy Aggravating factors: Too much WB, prolonged knee flexion, like in a  car Relieving factors: CBD gummie (rare)  PRECAUTIONS: Knee  WEIGHT BEARING RESTRICTIONS: Yes LLE WBAT  FALLS:  Has patient fallen in last 6 months? Only with initial injury  LIVING ENVIRONMENT: Lives with: lives with their family and 2 dogs ~100#   Lives in: [place] with basement (his bedroom) but staying upstairs for  now. Stairs: Yes: Internal: 14 steps; on right going up and ramp at main entrance Has following equipment at home: Crutches and Bledsoe brace  OCCUPATION: works at [employer] - lift up to 75#, on feet for 5 hours  PLOF: Independent  PATIENT GOALS:  return to work,  function in community.  Next MD visit:  05/24/2023  OBJECTIVE:  DIAGNOSTIC FINDINGS: MRI 03/29/23: impression - 1. Complete ACL tear. 2. No meniscal tear of the left knee. 3. Osseous contusions of the posterolateral tibial plateau, posteromedial tibial plateau and peripheral corner of the medial femoral condyle. 4. Large joint effusion.  PATIENT SURVEYS:  07/02/2023 FOTO 57  06/07/2023 FOTO visit 12  55%  Eval:  FOTO intake:  43%  predicted:  69%  COGNITION: Overall cognitive status: WFL    SENSATION: WFL  EDEMA:  Left knee & calf edematous.    PALPATION: Tenderness to touch along entire joint line, patella tendon and quadriceps tendon.  LOWER EXTREMITY ROM:   ROM Left eval Left 05/06/23 Left/Right Active 05/14/2023 Left 05/19/23 Left 05/24/23 Left 06/03/23 Left 06/07/23 Left 06/15/23 Left/Right 06/25/23 Left/Right 07/09/2023  Hip flexion            Hip extension            Hip abduction            Hip adduction            Hip internal rotation            Hip external rotation            Knee flexion P: 72* P: 85 78/142 86 AROM heelslide sitting AA: 90  A:95 P:99  Standing //bar P: 113  Supine: A: 102 P:110 129/138 136/140  Knee extension P: -6*  0/0    Seated A: LAQ -6* Supine:A: 0 0/0 0/0  Ankle dorsiflexion            Ankle plantarflexion            Ankle inversion            Ankle eversion             (Blank rows = not tested)  LOWER EXTREMITY MMT:  MMT Left eval Left 06/02/22  Hip flexion    Hip extension    Hip abduction    Hip adduction    Hip internal rotation    Hip external rotation    Knee flexion 3-/5 4  Knee extension 3-/5 4  Ankle dorsiflexion    Ankle  plantarflexion    Ankle inversion    Ankle eversion     (Blank rows = not tested)  FUNCTIONAL TESTS:  18 inch chair transfer: Modified independent utilizing bilateral upper extremities with no weight on left lower extremity.   GAIT: 06/15/2023: -pt able to neg ramp & curb without AD and no knee instability noted.   Eval:  Distance walked: 66ft x2 Assistive device utilized: Crutches without brace then without crutches for second bout of ambulation Level of assistance: SBA / verbal cues Comments: Nonweightbearing left lower extremity  TODAY'S TREATMENT  DATE:   07/09/2023 Therapeutic Exercises: Prone quadriceps stretch (left) with strap 4 x 20 seconds  Functional Activities: Practical lifting from floor for return to work: Diagonal squat lift with 5 and 10 pounds 5 x each Chair squats (just touch glutes to chair then explode up, slow eccentrics) 5 x  Neuromuscular re-education: Y balance Test:  Left 60.5; 96; 77.5 cm Right 67; 106; 97.5 cm Y balance standing left and reaching right 5 x Bilateral knee jump and soft handing on leg press 81# 15 x Alternating single leg jump (jump left, land right and jump right land left) 30 x slow eccentrics, land softly   07/07/2023  Therapeutic Exercise: Recumbent bike Seat 8 level 8 with 3 minute warm-up then 0:15 sec gentle pedal followed by 0:15 sec sprint, 5 min for 8 min total.  Therapeutic Activities: -Kettle bell squats with 20# 10 x each flat surface and incline up hill & down hill slow eccentrics -Golfer's lift (for work) with [employer] bell 5 x left & 5 x right -walking 60' X 2 carrying 25# box. -moving 25# box 90* between 2 chairs 18 high 10 reps from upper handles and 10 reps from lower handle. -standing midway between 2 18 chairs ~60* angle - moving 25# box between chairs with wide stationary stance 10 reps from upper handles and 10 reps from lower  handle. -stairs alternating pattern 11 steps 1st flight light rail support and 2nd flight no rail -power push / pull with pulley weights 50# (25# per stack) walking out / back with weight behind 5 reps & with weight in front 5 reps -power lateral 25# with BUEs on pulley side stepping 5 reps with weight to right and 5 reps with weight to left -PT verbally reviewed building up to work tolerance by setting timer for 1 hour.  Work around house or exercise for 20 min, then rest 40 min for 5 hours (lower of his work time).  Build up to work time similar to work including hours. Some activities may less intense light sweeping but his is on his feet.  Pt verbalized understanding.   Neuromuscular Re-education: -Bil leg jump on leg press 81# 15 x -Jump right, land left 15 x 43# -Jump left, land right 15 x 50#   07/05/2023 Therapeutic Exercise: Recumbent bike Seat 8 level 8 with 4 minute warm-up then 0:15 sec gentle pedal followed by 0:15 sec sprint, 8 min total.   Therapeutic Activities: -PT verbally educated on building up to work tolerance by setting timer for 1 hour.  Work around house or exercise for 20 min, then rest 40 min for 5 hours (lower of his work time).  Build up to work time similar to work including hours.  Pt verbalized understanding including recommendation to try tomorrow.   -Kettle bell squats with 20# 10 x each flat surface and incline up hill & down hill slow eccentrics -Golfer's lift (for work) with [employer] bell 5 x left & 5 x right -walking 50' X 2 carrying 18# box. -moving 18# box 90* between 2 chairs 18 high 10 reps from upper handles and 10 reps from lower handle. -standing midway between 2 18 chairs ~60* angle - moving 18# box between chairs with wide stationary stance 10 reps.   Neuromuscular Re-education: -Bil leg jump on leg press 75# 15 x -Jump right, land left 15 x 37# -Jump left, land right 15 x 50#   PATIENT EDUCATION:  Education details: HEP, POC Person  educated: Patient  Education method: Explanation, Demonstration, Verbal cues, and Handouts Education comprehension: verbalized understanding, returned demonstration, and verbal cues required  HOME EXERCISE PROGRAM: Access Code: [email or web address] URL: [email or web address] Date: 05/31/2023 Prepared by: Grayce Spatz  Exercises - Standing Quad Set  - 3-4 x daily - 7 x weekly - 2-3 sets - 10 reps - 5 seconds hold - Seated Quad Set  - 3-4 x daily - 7 x weekly - 2-3 sets - 10 reps - 5 seconds hold - Ankle Alphabet in Elevation  - 2 x daily - 7 x weekly - 1 sets - >15 min hold - Seated Hamstring Set  - 3 x daily - 7 x weekly - 10 reps - 5 seconds hold - Seated Heel Slide  - 3 x daily - 7 x weekly - 10 reps - 5 seconds hold - standing knee extension  - 1 x daily - 7 x weekly - 4 sets - 30-15 reps - 5 seconds hold   ASSESSMENT: CLINICAL IMPRESSION: Leng and I discussed the importance of continued strength work (quadriceps and hamstrings), balance and quadriceps stretching to meet all long-term goals.  Seated straight leg raises, mini-ball squats with bounce, bridging/curls on Swiss ball, Y balance and prone quadriceps stretch were mentioned as particularly important for long-term success.  Jontavious and I discussed his desire to continue for another month at 1 x a week as you prepare to transition back to work and independent rehabilitation.  Given your history with a previous ACL reconstruction, I feel this is appropriate.  OBJECTIVE IMPAIRMENTS: Abnormal gait, decreased activity tolerance, decreased balance, decreased knowledge of use of DME, decreased mobility, difficulty walking, decreased ROM, decreased strength, increased edema, increased muscle spasms, and pain.   ACTIVITY LIMITATIONS: carrying, lifting, bending, standing, squatting, stairs, transfers, and locomotion level  PARTICIPATION LIMITATIONS: meal prep, cleaning, laundry, community activity, and occupation  PERSONAL FACTORS:  1-2 comorbidities: see PMH  are also affecting patient's functional outcome.   REHAB POTENTIAL: Good  CLINICAL DECISION MAKING: Stable/uncomplicated  EVALUATION COMPLEXITY: Low   GOALS: Goals reviewed with patient? Yes  SHORT TERM GOALS: (target date for Short term goals 06/02/2022)   1.  Patient will demonstrate independent use of home exercise program to maintain progress from in clinic treatments. Baseline: See objective data Goal status: Met 05/14/2023  2. PROM left knee 0* ext to 100* flexion Baseline: See objective data Goal status: Met 06/25/2023  3. Interim FOTO 50% Baseline: See objective data Goal status:  MET  06/07/2023  LONG TERM GOALS: (target dates for all long term goals  08/13/2022 )   1. Patient will demonstrate/report pain at worst less than or equal to 2/10 to facilitate minimal limitation in daily activity secondary to pain symptoms. Baseline: See objective data Goal status: Partially Met 07/02/2023   2. Patient will demonstrate independent use of home exercise program to facilitate ability to maintain/progress functional gains from skilled physical therapy services. Baseline: See objective data Goal status: Met 07/09/2023   3. Patient will demonstrate FOTO outcome > or = 69 % to indicate reduced disability due to condition. Baseline: See objective data Goal status: On Going 07/09/2023   4.  Patient will demonstrate left LE MMT 5/5 throughout to faciltiate usual transfers, stairs, squatting at [facility] for daily life.  Baseline: See objective data Goal status: On Going 07/02/2023   5.  Patient ambulates >500' without device and negotiates ramps, curbs and stairs single rail alternating pattern modified independent.  Baseline: See objective data Goal status: MET 06/15/2023  6.  Left knee AROM 0* - 105* Baseline: See objective data Goal status: Met 06/25/2023   7.  patient demonstrates ability to safely perform work related tasks.  Baseline: See objective  data Goal status:   On Going 07/09/2023  PLAN:  PT FREQUENCY:  1x/week  PT DURATION: 4-5 weeks  PLANNED INTERVENTIONS: Therapeutic exercises, Therapeutic activity, Neuro Muscular re-education, Balance training, Gait training, Patient/Family education, Joint mobilization, Stair training, DME instructions, Dry Needling, Electrical stimulation, Traction, Cryotherapy, vasopneumatic deviceMoist heat, Taping, Ultrasound, Ionotophoresis 4mg /ml Dexamethasone , and aquatic therapy, Manual therapy.  All included unless contraindicated  PLAN FOR NEXT SESSION: Continue to work on strength and endurance for returning to work at [employer] for 5-hour shifts.  Specific emphasis on balance, proprioception and thigh strength.  Y balance and running progression as appropriate.  Myer LELON Ivory, PT, MPT 07/09/2023, 5:03 PM

## 2023-07-19 ENCOUNTER — Encounter: Payer: Self-pay | Admitting: Surgical

## 2023-07-19 ENCOUNTER — Ambulatory Visit (INDEPENDENT_AMBULATORY_CARE_PROVIDER_SITE_OTHER): Payer: MEDICAID | Admitting: Surgical

## 2023-07-19 DIAGNOSIS — Z9889 Other specified postprocedural states: Secondary | ICD-10-CM

## 2023-07-19 NOTE — Progress Notes (Signed)
 Post-Op Visit Note   Patient: Jesse Howe           Date of Birth: 03-May-2006           MRN: [id number] Visit Date: 07/19/2023 PCP: Ronnald Nian, MD   Assessment & Plan:  Chief Complaint:  Chief Complaint  Patient presents with   Left Knee - Follow-up    Left knee ACL reconstruction 04/19/2023   Visit Diagnoses:  1. S/P ACL reconstruction     Plan: Patient is a 18 year old male who presents s/p left knee anterior cruciate ligament reconstruction on 04/19/2023.  Doing well overall with no new complaints.  He has not returned to work yet.  No longer taking any pain medication.  He has been in physical therapy about 3 times per week but is planning on reducing this frequency to about 1 time per week and continue with home exercise program.  He has no instability of his knee.  No mechanical symptoms of the knee.  His main complaint is that his knee fatigues fairly quickly after about 15 to 20 minutes and at this point he has a fair amount of soreness after that 20-minute period.  On exam, patient has 0 degrees extension and 125 degrees of knee flexion.  Excellent quad strength rated 5/5.  He has ACL graft that is stable both by Lachman exam and anterior drawer sign.  He has incisions that are well-healed.  No effusion.  No calf tenderness.  Negative Homans' sign.  No tenderness over the medial or lateral joint lines.  Plan is to hold off on return to work for now.  He works at [employer] which involves a lot of of lifting and squatting.  He pretty much has to stand up and walk around the entire shift where he works.  Do not really think that he can quite go back to this yet with the fatigue and symptoms and soreness that he has after standing or walking for does 20 minutes at this point.  Plan to reevaluate in 4 weeks and likely return to work at that time.  Patient agreed with plan.  He will reach out to the office sooner if he feels like he makes a big leap and wants to return to work  sooner.  Follow-Up Instructions: No follow-ups on file.   Orders:  No orders of the defined types were placed in this encounter.  No orders of the defined types were placed in this encounter.   Imaging: No results found.  PMFS History: Patient Active Problem List   Diagnosis Date Noted   Left anterior cruciate ligament tear 04/25/2023   Rupture of anterior cruciate ligament of right knee    Acute lateral meniscus tear of right knee    Complete tear of right ACL, initial encounter 03/18/2021   Acute medial meniscus tear of right knee 03/18/2021   Abnormal finding on EKG 06/19/2019   Need for prophylactic vaccination and inoculation against influenza 08/09/2018   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   Rhinitis, allergic 02/21/2015   Asthma, moderate persistent 02/21/2015   Encounter for follow-up 02/21/2015   Need for HPV vaccination 02/21/2015   Vaccine counseling 02/21/2015   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, requiring support (level 1) 12/07/2013   Developmental disorder 05/11/2013   Past Medical History:  Diagnosis Date   Allergy    RHINITIS   Asperger syndrome    sees psychiatry and counseling   Asthma    Phreesia 08/16/2020  Developmental disorder    Insomnia     Family History  Problem Relation Age of Onset   Arthritis Paternal Grandmother    Diabetes Paternal Grandmother    Depression Paternal Grandmother    Arthritis Paternal Grandfather    Diabetes Paternal Grandfather    Hypertension Paternal Grandfather    Arthritis Mother        rheumatoid   HIV Maternal Grandfather        Died in his 37's    Past Surgical History:  Procedure Laterality Date   ANTERIOR CRUCIATE LIGAMENT REPAIR Right 04/07/2021   Procedure: RIGHT KNEE RECONSTRUCTION ANTERIOR CRUCIATE LIGAMENT (ACL) WITH QUADRICEPS AUTOGRAFT, LATERAL MENISCAL DEBRIDEMENT;  Surgeon: Cammy Copa, MD;  [facility];  Service:  Orthopedics;  Laterality: Right;   ANTERIOR CRUCIATE LIGAMENT REPAIR Left 04/19/2023   Procedure: LEFT KNEE ARTHROSCOPY ANTERIOR CRUCIATE LIGAMENT (ACL) RECONSTRUCTION WITH QUADRICEP GRAFT;  Surgeon: Cammy Copa, MD;  Location: [facility];  Service: Orthopedics;  Laterality: Left;   TONSILLECTOMY AND ADENOIDECTOMY Bilateral March 2011   Social History   Occupational History   Not on file  Tobacco Use   Smoking status: Never   Smokeless tobacco: Never  Substance and Sexual Activity   Alcohol use: No   Drug use: No   Sexual activity: Not on file

## 2023-07-27 ENCOUNTER — Encounter: Payer: Self-pay | Admitting: Internal Medicine

## 2023-08-03 ENCOUNTER — Encounter: Payer: Self-pay | Admitting: Physical Therapy

## 2023-08-03 ENCOUNTER — Ambulatory Visit (INDEPENDENT_AMBULATORY_CARE_PROVIDER_SITE_OTHER): Payer: MEDICAID | Admitting: Physical Therapy

## 2023-08-03 DIAGNOSIS — M6281 Muscle weakness (generalized): Secondary | ICD-10-CM | POA: Diagnosis not present

## 2023-08-03 DIAGNOSIS — R600 Localized edema: Secondary | ICD-10-CM

## 2023-08-03 DIAGNOSIS — R2689 Other abnormalities of gait and mobility: Secondary | ICD-10-CM

## 2023-08-03 DIAGNOSIS — M25662 Stiffness of left knee, not elsewhere classified: Secondary | ICD-10-CM | POA: Diagnosis not present

## 2023-08-03 DIAGNOSIS — M25562 Pain in left knee: Secondary | ICD-10-CM | POA: Diagnosis not present

## 2023-08-03 NOTE — Therapy (Signed)
 OUTPATIENT PHYSICAL THERAPY TREATMENT NOTE  Patient Name: Jesse Howe MRN: [id number] DOB:[birth year], 18 y.o., male Today's Date: 08/03/2023  END OF SESSION:  PT End of Session - 08/03/23 0935     Visit Number 25    Number of Visits 30    Date for PT Re-Evaluation 08/13/23    Authorization Type Medicaid (24 visits authorized)    Authorization Time Period 2/18-4/11    Authorization - Visit Number 1    Authorization - Number of Visits 24    PT Start Time 0935    PT Stop Time 1013    PT Time Calculation (min) 38 min    Activity Tolerance Patient tolerated treatment well;No increased pain    Behavior During Therapy [facility] for tasks assessed/performed                  Past Medical History:  Diagnosis Date   Allergy    RHINITIS   Asperger syndrome    sees psychiatry and counseling   Asthma    [employer] 08/16/2020   Developmental disorder    Insomnia    Past Surgical History:  Procedure Laterality Date   ANTERIOR CRUCIATE LIGAMENT REPAIR Right 04/07/2021   Procedure: RIGHT KNEE RECONSTRUCTION ANTERIOR CRUCIATE LIGAMENT (ACL) WITH QUADRICEPS AUTOGRAFT, LATERAL MENISCAL DEBRIDEMENT;  Surgeon: Cammy Copa, MD;  Location: [facility];  Service: Orthopedics;  Laterality: Right;   ANTERIOR CRUCIATE LIGAMENT REPAIR Left 04/19/2023   Procedure: LEFT KNEE ARTHROSCOPY ANTERIOR CRUCIATE LIGAMENT (ACL) RECONSTRUCTION WITH QUADRICEP GRAFT;  Surgeon: Cammy Copa, MD;  Location: [facility] OR;  Service: Orthopedics;  Laterality: Left;   TONSILLECTOMY AND ADENOIDECTOMY Bilateral March 2011   Patient Active Problem List   Diagnosis Date Noted   Left anterior cruciate ligament tear 04/25/2023   Rupture of anterior cruciate ligament of right knee    Acute lateral meniscus tear of right knee    Complete tear of right ACL, initial encounter 03/18/2021   Acute medial meniscus tear of right knee 03/18/2021   Abnormal finding on EKG 06/19/2019   Need for prophylactic  vaccination and inoculation against influenza 08/09/2018   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   Rhinitis, allergic 02/21/2015   Asthma, moderate persistent 02/21/2015   Encounter for follow-up 02/21/2015   Need for HPV vaccination 02/21/2015   Vaccine counseling 02/21/2015   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, requiring support (level 1) 12/07/2013   Developmental disorder 05/11/2013    PCP: Ronnald Nian, MD  REFERRING PROVIDER: Cammy Copa, MD  REFERRING DIAG: [id number] (ICD-10-CM) - S/P ACL reconstruction   THERAPY DIAG:  Muscle weakness (generalized)  Acute pain of left knee  Stiffness of left knee, not elsewhere classified  Other abnormalities of gait and mobility  Localized edema  Rationale for Evaluation and Treatment: Rehabilitation  ONSET DATE: 04/19/2023 Left knee ACL arthroscopy  SUBJECTIVE:   SUBJECTIVE STATEMENT: Doing well; has been working on strengthening at home   PERTINENT HISTORY: Left ACL reconstruction with quad autograft 04/19/23, right ACL reconstruction with quad autograft 04/07/21, asthma, 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, requiring support, developmental disorder  PAIN:  NPRS scale:  0-2/10 this week Pain location: Left knee inside joint Pain description: Achy Aggravating factors: Too much WB, prolonged knee flexion, like in a car Relieving factors: CBD gummie (rare)  PRECAUTIONS: Knee  WEIGHT BEARING RESTRICTIONS: Yes LLE WBAT  FALLS:  Has patient fallen in last 6 months? Only with initial injury  LIVING ENVIRONMENT: Lives with:  lives with their family and 2 dogs ~100#   Lives in: [place] with basement (his bedroom) but staying upstairs for now. Stairs: Yes: Internal: 14 steps; on right going up and ramp at main entrance Has following equipment at home: Crutches and Bledsoe brace  OCCUPATION: works at [employer] - lift up to 75#,  on feet for 5 hours  PLOF: Independent  PATIENT GOALS:  return to work,  function in community.  Next MD visit:  05/24/2023  OBJECTIVE:  DIAGNOSTIC FINDINGS: MRI 03/29/23: impression - 1. Complete ACL tear. 2. No meniscal tear of the left knee. 3. Osseous contusions of the posterolateral tibial plateau, posteromedial tibial plateau and peripheral corner of the medial femoral condyle. 4. Large joint effusion.  PATIENT SURVEYS:  07/02/2023 FOTO 57  06/07/2023 FOTO visit 12  55%  Eval:  FOTO intake:  43%  predicted:  69%  COGNITION: Overall cognitive status: WFL    SENSATION: WFL  EDEMA:  Left knee & calf edematous.    PALPATION: Tenderness to touch along entire joint line, patella tendon and quadriceps tendon.  LOWER EXTREMITY ROM:   ROM Left eval Left 05/06/23 Left/Right Active 05/14/2023 Left 05/19/23 Left 05/24/23 Left 06/03/23 Left 06/07/23 Left 06/15/23 Left/Right 06/25/23 Left/Right 07/09/2023  Knee flexion P: 72* P: 85 78/142 86 AROM heelslide sitting AA: 90  A:95 P:99  Standing //bar P: 113  Supine: A: 102 P:110 129/138 136/140  Knee extension P: -6*  0/0    Seated A: LAQ -6* Supine:A: 0 0/0 0/0   (Blank rows = not tested)  LOWER EXTREMITY MMT:  MMT Left eval Left 06/02/22  Knee flexion 3-/5 4  Knee extension 3-/5 4   (Blank rows = not tested)  FUNCTIONAL TESTS:  18 inch chair transfer: Modified independent utilizing bilateral upper extremities with no weight on left lower extremity.   GAIT: 06/15/2023: -pt able to neg ramp & curb without AD and no knee instability noted.   Eval:  Distance walked: 80ft x2 Assistive device utilized: Crutches without brace then without crutches for second bout of ambulation Level of assistance: SBA / verbal cues Comments: Nonweightbearing left lower extremity  TODAY'S TREATMENT DATE:   08/03/23 TherAct Leg press bil 143# 3x10; LLE only 75# 3x10 Squats on ramp with 15# KB 3x10 - cues to decrease Rt  lean   TherEx Knee extension machine LLE only 5# 3x10 (pain minimized range) Knee flexion machine LLE only 15# 3x10 Supine hamstring stretch 3x30 sec on Lt Prone quad stretch 3x30 sec on LT    07/09/2023 Therapeutic Exercises: Prone quadriceps stretch (left) with strap 4 x 20 seconds  Functional Activities: Practical lifting from floor for return to work: Diagonal squat lift with 5 and 10 pounds 5 x each Chair squats (just touch glutes to chair then explode up, slow eccentrics) 5 x  Neuromuscular re-education: Y balance Test:  Left 60.5; 96; 77.5 cm Right 67; 106; 97.5 cm Y balance standing left and reaching right 5 x Bilateral knee jump and soft handing on leg press 81# 15 x Alternating single leg jump (jump left, land right and jump right land left) 30 x slow eccentrics, land softly   07/07/2023  Therapeutic Exercise: Recumbent bike Seat 8 level 8 with 3 minute warm-up then 0:15 sec gentle pedal followed by 0:15 sec sprint, 5 min for 8 min total.  Therapeutic Activities: -Kettle bell squats with 20# 10 x each flat surface and incline up hill & down hill slow eccentrics -Golfer's  lift (for work) with [employer] bell 5 x left & 5 x right -walking 60' X 2 carrying 25# box. -moving 25# box 90* between 2 chairs 18" high 10 reps from upper handles and 10 reps from lower handle. -standing midway between 2 18" chairs ~60* angle - moving 25# box between chairs with wide stationary stance 10 reps from upper handles and 10 reps from lower handle. -stairs alternating pattern 11 steps 1st flight light rail support and 2nd flight no rail -power push / pull with pulley weights 50# (25# per stack) walking out / back with weight behind 5 reps & with weight in front 5 reps -power lateral 25# with BUEs on pulley side stepping 5 reps with weight to right and 5 reps with weight to left -PT verbally reviewed building up to work tolerance by setting timer for 1 hour.  Work around house or exercise  for 20 min, then rest 40 min for 5 hours (lower of his work time).  Build up to work time similar to work including hours. Some activities may less intense light sweeping but his is on his feet.  Pt verbalized understanding.   Neuromuscular Re-education: -Bil leg jump on leg press 81# 15 x -Jump right, land left 15 x 43# -Jump left, land right 15 x 50#    PATIENT EDUCATION:  Education details: HEP, POC Person educated: Patient Education method: Programmer, multimedia, Demonstration, Verbal cues, and Handouts Education comprehension: verbalized understanding, returned demonstration, and verbal cues required  HOME EXERCISE PROGRAM: Access Code: [email or web address] URL: [email or web address] Date: 05/31/2023 Prepared by: Vladimir Faster  Exercises - Standing Quad Set  - 3-4 x daily - 7 x weekly - 2-3 sets - 10 reps - 5 seconds hold - Seated Quad Set  - 3-4 x daily - 7 x weekly - 2-3 sets - 10 reps - 5 seconds hold - Ankle Alphabet in Elevation  - 2 x daily - 7 x weekly - 1 sets - >15 min hold - Seated Hamstring Set  - 3 x daily - 7 x weekly - 10 reps - 5 seconds hold - Seated Heel Slide  - 3 x daily - 7 x weekly - 10 reps - 5 seconds hold - standing knee extension  - 1 x daily - 7 x weekly - 4 sets - 30-15 reps - 5 seconds hold   ASSESSMENT: CLINICAL IMPRESSION: Pt tolerated session well today; still with visible quad shaking with exercises due to weakness.  Did initiate open chain exercises today with good tolerance.  Continue skilled PT.  OBJECTIVE IMPAIRMENTS: Abnormal gait, decreased activity tolerance, decreased balance, decreased knowledge of use of DME, decreased mobility, difficulty walking, decreased ROM, decreased strength, increased edema, increased muscle spasms, and pain.   ACTIVITY LIMITATIONS: carrying, lifting, bending, standing, squatting, stairs, transfers, and locomotion level  PARTICIPATION LIMITATIONS: meal prep, cleaning, laundry, community activity, and  occupation  PERSONAL FACTORS: 1-2 comorbidities: see PMH  are also affecting patient's functional outcome.   REHAB POTENTIAL: Good  CLINICAL DECISION MAKING: Stable/uncomplicated  EVALUATION COMPLEXITY: Low   GOALS: Goals reviewed with patient? Yes  SHORT TERM GOALS: (target date for Short term goals 06/02/2022)   1.  Patient will demonstrate independent use of home exercise program to maintain progress from in clinic treatments. Baseline: See objective data Goal status: Met 05/14/2023  2. PROM left knee 0* ext to 100* flexion Baseline: See objective data Goal status: Met 06/25/2023  3. Interim FOTO 50% Baseline: See objective data Goal status:  MET  06/07/2023  LONG TERM GOALS: (target dates for all long term goals  08/13/2022 )   1. Patient will demonstrate/report pain at worst less than or equal to 2/10 to facilitate minimal limitation in daily activity secondary to pain symptoms. Baseline: See objective data Goal status: Partially Met 07/02/2023   2. Patient will demonstrate independent use of home exercise program to facilitate ability to maintain/progress functional gains from skilled physical therapy services. Baseline: See objective data Goal status: Met 07/09/2023   3. Patient will demonstrate FOTO outcome > or = 69 % to indicate reduced disability due to condition. Baseline: See objective data Goal status: On Going 07/09/2023   4.  Patient will demonstrate left LE MMT 5/5 throughout to faciltiate usual transfers, stairs, squatting at [facility] for daily life.  Baseline: See objective data Goal status: On Going 07/02/2023   5.  Patient ambulates >500' without device and negotiates ramps, curbs and stairs single rail alternating pattern modified independent.  Baseline: See objective data Goal status: MET 06/15/2023   6.  Left knee AROM 0* - 105* Baseline: See objective data Goal status: Met 06/25/2023   7.  patient demonstrates ability to safely perform work related tasks.   Baseline: See objective data Goal status:   On Going 07/09/2023  PLAN:  PT FREQUENCY:  1x/week  PT DURATION: 4-5 weeks  PLANNED INTERVENTIONS: Therapeutic exercises, Therapeutic activity, Neuro Muscular re-education, Balance training, Gait training, Patient/Family education, Joint mobilization, Stair training, DME instructions, Dry Needling, Electrical stimulation, Traction, Cryotherapy, vasopneumatic deviceMoist heat, Taping, Ultrasound, Ionotophoresis 4mg /ml Dexamethasone, and aquatic therapy, Manual therapy.  All included unless contraindicated  PLAN FOR NEXT SESSION:  Continue to work on strength and endurance for returning to work at [employer] for 5-hour shifts.  Specific emphasis on balance, proprioception and thigh strength.  Y balance and running progression as appropriate. Okay for open chain exercises.   Clarita Crane, PT, DPT 08/03/23 10:14 AM

## 2023-08-04 NOTE — Addendum Note (Signed)
 Addended by: Wende Crease on: 08/04/2023 02:29 PM   Modules accepted: Orders

## 2023-08-06 ENCOUNTER — Ambulatory Visit: Payer: MEDICAID | Admitting: Physical Therapy

## 2023-08-06 ENCOUNTER — Encounter: Payer: Self-pay | Admitting: Physical Therapy

## 2023-08-06 DIAGNOSIS — M25562 Pain in left knee: Secondary | ICD-10-CM | POA: Diagnosis not present

## 2023-08-06 DIAGNOSIS — M6281 Muscle weakness (generalized): Secondary | ICD-10-CM | POA: Diagnosis not present

## 2023-08-06 DIAGNOSIS — M25662 Stiffness of left knee, not elsewhere classified: Secondary | ICD-10-CM | POA: Diagnosis not present

## 2023-08-06 DIAGNOSIS — R600 Localized edema: Secondary | ICD-10-CM

## 2023-08-06 DIAGNOSIS — R2689 Other abnormalities of gait and mobility: Secondary | ICD-10-CM

## 2023-08-06 NOTE — Therapy (Signed)
 OUTPATIENT PHYSICAL THERAPY TREATMENT NOTE  Patient Name: TIMOTHEUS SALM MRN: [id number] DOB:Sep 19, 2005, 18 y.o., male Today's Date: 08/06/2023  END OF SESSION:  PT End of Session - 08/06/23 1026     Visit Number 26    Number of Visits 30    Date for PT Re-Evaluation 08/13/23    Authorization Type Medicaid (24 visits authorized)    Authorization Time Period 2/18-4/11    Authorization - Number of Visits 24    PT Start Time 1024   pt arrived late   PT Stop Time 1058    PT Time Calculation (min) 34 min    Activity Tolerance Patient tolerated treatment well;No increased pain    Behavior During Therapy [facility] for tasks assessed/performed                   Past Medical History:  Diagnosis Date   Allergy    RHINITIS   Asperger syndrome    sees psychiatry and counseling   Asthma    [employer] 08/16/2020   Developmental disorder    Insomnia    Past Surgical History:  Procedure Laterality Date   ANTERIOR CRUCIATE LIGAMENT REPAIR Right 04/07/2021   Procedure: RIGHT KNEE RECONSTRUCTION ANTERIOR CRUCIATE LIGAMENT (ACL) WITH QUADRICEPS AUTOGRAFT, LATERAL MENISCAL DEBRIDEMENT;  Surgeon: Cammy Copa, MD;  Location: [facility];  Service: Orthopedics;  Laterality: Right;   ANTERIOR CRUCIATE LIGAMENT REPAIR Left 04/19/2023   Procedure: LEFT KNEE ARTHROSCOPY ANTERIOR CRUCIATE LIGAMENT (ACL) RECONSTRUCTION WITH QUADRICEP GRAFT;  Surgeon: Cammy Copa, MD;  Location: [facility];  Service: Orthopedics;  Laterality: Left;   TONSILLECTOMY AND ADENOIDECTOMY Bilateral March 2011   Patient Active Problem List   Diagnosis Date Noted   Left anterior cruciate ligament tear 04/25/2023   Rupture of anterior cruciate ligament of right knee    Acute lateral meniscus tear of right knee    Complete tear of right ACL, initial encounter 03/18/2021   Acute medial meniscus tear of right knee 03/18/2021   Abnormal finding on EKG 06/19/2019   Need for prophylactic vaccination and  inoculation against influenza 08/09/2018   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   Rhinitis, allergic 02/21/2015   Asthma, moderate persistent 02/21/2015   Encounter for follow-up 02/21/2015   Need for HPV vaccination 02/21/2015   Vaccine counseling 02/21/2015   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, requiring support (level 1) 12/07/2013   Developmental disorder 05/11/2013    PCP: Ronnald Nian, MD  REFERRING PROVIDER: Cammy Copa, MD  REFERRING DIAG: [phone] (ICD-10-CM) - S/P ACL reconstruction   THERAPY DIAG:  Muscle weakness (generalized)  Acute pain of left knee  Stiffness of left knee, not elsewhere classified  Other abnormalities of gait and mobility  Localized edema  Rationale for Evaluation and Treatment: Rehabilitation  ONSET DATE: 04/19/2023 Left knee ACL arthroscopy  SUBJECTIVE:   SUBJECTIVE STATEMENT: Doing well; has been working on strengthening at home   PERTINENT HISTORY: Left ACL reconstruction with quad autograft 04/19/23, right ACL reconstruction with quad autograft 04/07/21, asthma, 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, requiring support, developmental disorder  PAIN:  NPRS scale:  0-2/10 this week Pain location: Left knee inside joint Pain description: Achy Aggravating factors: Too much WB, prolonged knee flexion, like in a car Relieving factors: CBD gummie (rare)  PRECAUTIONS: Knee  WEIGHT BEARING RESTRICTIONS: Yes LLE WBAT  FALLS:  Has patient fallen in last 6 months? Only with initial injury  LIVING ENVIRONMENT: Lives with: lives with their  family and 2 dogs ~100#   Lives in: [place] with basement (his bedroom) but staying upstairs for now. Stairs: Yes: Internal: 14 steps; on right going up and ramp at main entrance Has following equipment at home: Crutches and Bledsoe brace  OCCUPATION: works at [employer] - lift up to 75#, on feet for 5  hours  PLOF: Independent  PATIENT GOALS:  return to work,  function in community.  Next MD visit:  05/24/2023  OBJECTIVE:  DIAGNOSTIC FINDINGS: MRI 03/29/23: impression - 1. Complete ACL tear. 2. No meniscal tear of the left knee. 3. Osseous contusions of the posterolateral tibial plateau, posteromedial tibial plateau and peripheral corner of the medial femoral condyle. 4. Large joint effusion.  PATIENT SURVEYS:  08/06/23: FOTO 61  07/02/2023 FOTO 57  06/07/2023 FOTO visit 12  55%  Eval:  FOTO intake:  43%  predicted:  69%  COGNITION: Overall cognitive status: WFL    SENSATION: WFL  EDEMA:  Left knee & calf edematous.    PALPATION: Tenderness to touch along entire joint line, patella tendon and quadriceps tendon.  LOWER EXTREMITY ROM:   ROM Left eval Left 05/06/23 Left/Right Active 05/14/2023 Left 05/19/23 Left 05/24/23 Left 06/03/23 Left 06/07/23 Left 06/15/23 Left/Right 06/25/23 Left/Right 07/09/2023  Knee flexion P: 72* P: 85 78/142 86 AROM heelslide sitting AA: 90  A:95 P:99  Standing //bar P: 113  Supine: A: 102 P:110 129/138 136/140  Knee extension P: -6*  0/0    Seated A: LAQ -6* Supine:A: 0 0/0 0/0   (Blank rows = not tested)  LOWER EXTREMITY MMT:  MMT Left eval Left 06/02/22  Knee flexion 3-/5 4  Knee extension 3-/5 4   (Blank rows = not tested)  FUNCTIONAL TESTS:  18 inch chair transfer: Modified independent utilizing bilateral upper extremities with no weight on left lower extremity.   GAIT: 06/15/2023: -pt able to neg ramp & curb without AD and no knee instability noted.   Eval:  Distance walked: 26ft x2 Assistive device utilized: Crutches without brace then without crutches for second bout of ambulation Level of assistance: SBA / verbal cues Comments: Nonweightbearing left lower extremity  TODAY'S TREATMENT DATE:   08/06/23 TherEx Recumbent bike L6 x 5 min Knee extension machine LLE only(occasional assist from RLE) 5# 3x10 (pain minimized  range)   TherAct Leg press bil 143# 3x10; LLE only 75# 3x10 Variable lateral shuffle to Blaze Pods (5) 30 sec on/30 sec rest x 3 cycles Variable forward/backward shuffle to Blaze Pods (5) 30 sec on/30 sec rest x 3 cycles  08/03/23 TherAct Leg press bil 143# 3x10; LLE only 75# 3x10 Squats on ramp with 15# KB 3x10 - cues to decrease Rt lean  TherEx Knee extension machine LLE only 5# 3x10 (pain minimized range) Knee flexion machine LLE only 15# 3x10 Supine hamstring stretch 3x30 sec on Lt Prone quad stretch 3x30 sec on LT    07/09/2023 Therapeutic Exercises: Prone quadriceps stretch (left) with strap 4 x 20 seconds  Functional Activities: Practical lifting from floor for return to work: Diagonal squat lift with 5 and 10 pounds 5 x each Chair squats (just touch glutes to chair then explode up, slow eccentrics) 5 x  Neuromuscular re-education: Y balance Test:  Left 60.5; 96; 77.5 cm Right 67; 106; 97.5 cm Y balance standing left and reaching right 5 x Bilateral knee jump and soft handing on leg press 81# 15 x Alternating single leg jump (jump left, land right and jump  right land left) 30 x slow eccentrics, land softly   07/07/2023  Therapeutic Exercise: Recumbent bike Seat 8 level 8 with 3 minute warm-up then 0:15 sec gentle pedal followed by 0:15 sec sprint, 5 min for 8 min total.  Therapeutic Activities: -Kettle bell squats with 20# 10 x each flat surface and incline up hill & down hill slow eccentrics -Golfer's lift (for work) with [employer] bell 5 x left & 5 x right -walking 60' X 2 carrying 25# box. -moving 25# box 90* between 2 chairs 18" high 10 reps from upper handles and 10 reps from lower handle. -standing midway between 2 18" chairs ~60* angle - moving 25# box between chairs with wide stationary stance 10 reps from upper handles and 10 reps from lower handle. -stairs alternating pattern 11 steps 1st flight light rail support and 2nd flight no rail -power push / pull  with pulley weights 50# (25# per stack) walking out / back with weight behind 5 reps & with weight in front 5 reps -power lateral 25# with BUEs on pulley side stepping 5 reps with weight to right and 5 reps with weight to left -PT verbally reviewed building up to work tolerance by setting timer for 1 hour.  Work around house or exercise for 20 min, then rest 40 min for 5 hours (lower of his work time).  Build up to work time similar to work including hours. Some activities may less intense light sweeping but his is on his feet.  Pt verbalized understanding.   Neuromuscular Re-education: -Bil leg jump on leg press 81# 15 x -Jump right, land left 15 x 43# -Jump left, land right 15 x 50#    PATIENT EDUCATION:  Education details: HEP, POC Person educated: Patient Education method: Programmer, multimedia, Demonstration, Verbal cues, and Handouts Education comprehension: verbalized understanding, returned demonstration, and verbal cues required  HOME EXERCISE PROGRAM: Access Code: [email or web address] URL: [email or web address] Date: 05/31/2023 Prepared by: Vladimir Faster  Exercises - Standing Quad Set  - 3-4 x daily - 7 x weekly - 2-3 sets - 10 reps - 5 seconds hold - Seated Quad Set  - 3-4 x daily - 7 x weekly - 2-3 sets - 10 reps - 5 seconds hold - Ankle Alphabet in Elevation  - 2 x daily - 7 x weekly - 1 sets - >15 min hold - Seated Hamstring Set  - 3 x daily - 7 x weekly - 10 reps - 5 seconds hold - Seated Heel Slide  - 3 x daily - 7 x weekly - 10 reps - 5 seconds hold - standing knee extension  - 1 x daily - 7 x weekly - 4 sets - 30-15 reps - 5 seconds hold   ASSESSMENT: CLINICAL IMPRESSION: Pt with some Lt knee discomfort with shuttle activities which resolved quickly with rest.  Overall progressing well with PT still limited by weakness.  Continue per POC.   OBJECTIVE IMPAIRMENTS: Abnormal gait, decreased activity tolerance, decreased balance, decreased knowledge of use of DME, decreased  mobility, difficulty walking, decreased ROM, decreased strength, increased edema, increased muscle spasms, and pain.   ACTIVITY LIMITATIONS: carrying, lifting, bending, standing, squatting, stairs, transfers, and locomotion level  PARTICIPATION LIMITATIONS: meal prep, cleaning, laundry, community activity, and occupation  PERSONAL FACTORS: 1-2 comorbidities: see PMH  are also affecting patient's functional outcome.   REHAB POTENTIAL: Good  CLINICAL DECISION MAKING: Stable/uncomplicated  EVALUATION COMPLEXITY: Low   GOALS: Goals reviewed with patient? Yes  SHORT TERM GOALS: (target date  for Short term goals 06/02/2022)   1.  Patient will demonstrate independent use of home exercise program to maintain progress from in clinic treatments. Baseline: See objective data Goal status: Met 05/14/2023  2. PROM left knee 0* ext to 100* flexion Baseline: See objective data Goal status: Met 06/25/2023  3. Interim FOTO 50% Baseline: See objective data Goal status:  MET  06/07/2023  LONG TERM GOALS: (target dates for all long term goals  08/13/2022 )   1. Patient will demonstrate/report pain at worst less than or equal to 2/10 to facilitate minimal limitation in daily activity secondary to pain symptoms. Baseline: See objective data Goal status: Partially Met 07/02/2023   2. Patient will demonstrate independent use of home exercise program to facilitate ability to maintain/progress functional gains from skilled physical therapy services. Baseline: See objective data Goal status: Met 07/09/2023   3. Patient will demonstrate FOTO outcome > or = 69 % to indicate reduced disability due to condition. Baseline: See objective data Goal status: On Going 07/09/2023   4.  Patient will demonstrate left LE MMT 5/5 throughout to faciltiate usual transfers, stairs, squatting at [facility] for daily life.  Baseline: See objective data Goal status: On Going 07/02/2023   5.  Patient ambulates >500' without device  and negotiates ramps, curbs and stairs single rail alternating pattern modified independent.  Baseline: See objective data Goal status: MET 06/15/2023   6.  Left knee AROM 0* - 105* Baseline: See objective data Goal status: Met 06/25/2023   7.  patient demonstrates ability to safely perform work related tasks.  Baseline: See objective data Goal status:   On Going 07/09/2023  PLAN:  PT FREQUENCY:  1x/week  PT DURATION: 4-5 weeks  PLANNED INTERVENTIONS: Therapeutic exercises, Therapeutic activity, Neuro Muscular re-education, Balance training, Gait training, Patient/Family education, Joint mobilization, Stair training, DME instructions, Dry Needling, Electrical stimulation, Traction, Cryotherapy, vasopneumatic deviceMoist heat, Taping, Ultrasound, Ionotophoresis 4mg /ml Dexamethasone, and aquatic therapy, Manual therapy.  All included unless contraindicated  PLAN FOR NEXT SESSION:  Continue to work on strength and endurance for returning to work at [employer] for 5-hour shifts.  Specific emphasis on balance, proprioception and thigh strength.  Y balance and running progression as appropriate. Okay for open chain exercises.   Clarita Crane, PT, DPT 08/06/23 11:00 AM

## 2023-08-09 ENCOUNTER — Telehealth: Payer: Self-pay | Admitting: Physical Therapy

## 2023-08-09 ENCOUNTER — Encounter: Payer: MEDICAID | Admitting: Physical Therapy

## 2023-08-09 NOTE — Telephone Encounter (Signed)
 PT left message regarding no-show appt.

## 2023-08-16 ENCOUNTER — Ambulatory Visit (INDEPENDENT_AMBULATORY_CARE_PROVIDER_SITE_OTHER): Payer: MEDICAID | Admitting: Physical Therapy

## 2023-08-16 ENCOUNTER — Encounter: Payer: Self-pay | Admitting: Surgical

## 2023-08-16 ENCOUNTER — Ambulatory Visit (INDEPENDENT_AMBULATORY_CARE_PROVIDER_SITE_OTHER): Payer: MEDICAID | Admitting: Surgical

## 2023-08-16 ENCOUNTER — Encounter: Payer: Self-pay | Admitting: Physical Therapy

## 2023-08-16 DIAGNOSIS — M25562 Pain in left knee: Secondary | ICD-10-CM | POA: Diagnosis not present

## 2023-08-16 DIAGNOSIS — Z9889 Other specified postprocedural states: Secondary | ICD-10-CM

## 2023-08-16 DIAGNOSIS — M6281 Muscle weakness (generalized): Secondary | ICD-10-CM | POA: Diagnosis not present

## 2023-08-16 DIAGNOSIS — R2689 Other abnormalities of gait and mobility: Secondary | ICD-10-CM

## 2023-08-16 DIAGNOSIS — M25662 Stiffness of left knee, not elsewhere classified: Secondary | ICD-10-CM

## 2023-08-16 DIAGNOSIS — R600 Localized edema: Secondary | ICD-10-CM

## 2023-08-16 NOTE — Therapy (Signed)
 OUTPATIENT PHYSICAL THERAPY TREATMENT NOTE & DISCHARGE SUMMARY  Patient Name: Jesse Howe MRN: [id number] DOB:29-Apr-2006, 18 y.o., male Today's Date: 08/16/2023  PHYSICAL THERAPY DISCHARGE SUMMARY  Visits from Start of Care: 27  Current functional level related to goals / functional outcomes: SEE BELOW   Remaining deficits: See Below   Education / Equipment: Patient was educated in ongoing HEP which he appears to understand.    Patient agrees to discharge. Patient goals were met. Patient is being discharged due to meeting the stated rehab goals.   END OF SESSION:  PT End of Session - 08/16/23 1521     Visit Number 27    Number of Visits 30    Date for PT Re-Evaluation 08/13/23    Authorization Type Medicaid (24 visits authorized)    Authorization Time Period 2/18-4/11    PT Start Time 1515    PT Stop Time 1553    PT Time Calculation (min) 38 min    Activity Tolerance Patient tolerated treatment well;No increased pain    Behavior During Therapy [facility] for tasks assessed/performed                    Past Medical History:  Diagnosis Date   Allergy    RHINITIS   Asperger syndrome    sees psychiatry and counseling   Asthma    Phreesia 08/16/2020   Developmental disorder    Insomnia    Past Surgical History:  Procedure Laterality Date   ANTERIOR CRUCIATE LIGAMENT REPAIR Right 04/07/2021   Procedure: RIGHT KNEE RECONSTRUCTION ANTERIOR CRUCIATE LIGAMENT (ACL) WITH QUADRICEPS AUTOGRAFT, LATERAL MENISCAL DEBRIDEMENT;  Surgeon: Cammy Copa, MD;  Location: [facility];  Service: Orthopedics;  Laterality: Right;   ANTERIOR CRUCIATE LIGAMENT REPAIR Left 04/19/2023   Procedure: LEFT KNEE ARTHROSCOPY ANTERIOR CRUCIATE LIGAMENT (ACL) RECONSTRUCTION WITH QUADRICEP GRAFT;  Surgeon: Cammy Copa, MD;  Location: [facility];  Service: Orthopedics;  Laterality: Left;   TONSILLECTOMY AND ADENOIDECTOMY Bilateral March 2011   Patient Active Problem  List   Diagnosis Date Noted   Left anterior cruciate ligament tear 04/25/2023   Rupture of anterior cruciate ligament of right knee    Acute lateral meniscus tear of right knee    Complete tear of right ACL, initial encounter 03/18/2021   Acute medial meniscus tear of right knee 03/18/2021   Abnormal finding on EKG 06/19/2019   Need for prophylactic vaccination and inoculation against influenza 08/09/2018   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   Rhinitis, allergic 02/21/2015   Asthma, moderate persistent 02/21/2015   Encounter for follow-up 02/21/2015   Need for HPV vaccination 02/21/2015   Vaccine counseling 02/21/2015   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, requiring support (level 1) 12/07/2013   Developmental disorder 05/11/2013    PCP: Ronnald Nian, MD  REFERRING PROVIDER: Ronnald Nian, MD  REFERRING DIAG: [phone] (ICD-10-CM) - S/P ACL reconstruction   THERAPY DIAG:  Muscle weakness (generalized)  Acute pain of left knee  Stiffness of left knee, not elsewhere classified  Other abnormalities of gait and mobility  Localized edema  Rationale for Evaluation and Treatment: Rehabilitation  ONSET DATE: 04/19/2023 Left knee ACL arthroscopy  SUBJECTIVE:   SUBJECTIVE STATEMENT: He saw doctor before PT today.  He is released for return to work 08/31/2023.    PERTINENT HISTORY: Left ACL reconstruction with quad autograft 04/19/23, right ACL reconstruction with quad autograft 04/07/21, asthma, 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, requiring support, developmental disorder  PAIN:  NPRS scale:    0/10 this week Pain location: Left knee inside joint Pain description: Achy Aggravating factors: Too much WB, prolonged knee flexion, like in a car Relieving factors: CBD gummie (rare)  PRECAUTIONS: Knee  WEIGHT BEARING RESTRICTIONS: Yes LLE WBAT  FALLS:  Has patient fallen in last 6 months? Only with  initial injury  LIVING ENVIRONMENT: Lives with: lives with their family and 2 dogs ~100#   Lives in: [place] with basement (his bedroom) but staying upstairs for now. Stairs: Yes: Internal: 14 steps; on right going up and ramp at main entrance Has following equipment at home: Crutches and Bledsoe brace  OCCUPATION: works at [employer] - lift up to 75#, on feet for 5 hours  PLOF: Independent  PATIENT GOALS:  return to work,  function in community.  Next MD visit:  05/24/2023  OBJECTIVE:  DIAGNOSTIC FINDINGS: MRI 03/29/23: impression - 1. Complete ACL tear. 2. No meniscal tear of the left knee. 3. Osseous contusions of the posterolateral tibial plateau, posteromedial tibial plateau and peripheral corner of the medial femoral condyle. 4. Large joint effusion.  PATIENT SURVEYS:  08/16/2023:  FOTO 69%  08/06/23: FOTO 61  07/02/2023 FOTO 57  06/07/2023 FOTO visit 12  55%  Eval:  FOTO intake:  43%  predicted:  69%  COGNITION: Overall cognitive status: WFL    SENSATION: WFL  EDEMA:  Left knee & calf edematous.    PALPATION: Tenderness to touch along entire joint line, patella tendon and quadriceps tendon.  LOWER EXTREMITY ROM:   ROM Left eval Left 05/06/23 Left/Right Active 05/14/2023 Left 05/19/23 Left 05/24/23 Left 06/03/23 Left 06/07/23 Left 06/15/23 Left/Right 06/25/23 Left/Right 07/09/2023  Knee flexion P: 72* P: 85 78/142 86 AROM heelslide sitting AA: 90  A:95 P:99  Standing //bar P: 113  Supine: A: 102 P:110 129/138 136/140  Knee extension P: -6*  0/0    Seated A: LAQ -6* Supine:A: 0 0/0 0/0   (Blank rows = not tested)  LOWER EXTREMITY MMT:  MMT Left eval Left 06/02/22 Right 08/16/23 Left 08/16/23  Knee flexion 3-/5 4    Knee extension 3-/5 4 5/5 HH dynameter 79.7, 78.3 5/5 HH dynameter 52.5#, 53.3# LLE:RLE 67%   (Blank rows = not tested)  FUNCTIONAL TESTS:  18 inch chair transfer: Modified independent utilizing bilateral upper extremities  with no weight on left lower extremity.   GAIT: 06/15/2023: -pt able to neg ramp & curb without AD and no knee instability noted.   Eval:  Distance walked: 56ft x2 Assistive device utilized: Crutches without brace then without crutches for second bout of ambulation Level of assistance: SBA / verbal cues Comments: Nonweightbearing left lower extremity  TODAY'S TREATMENT DATE:   08/16/2023 TherEx Recumbent bike L5-7 x 8 min See objective data for MMT Standing hamstring stretch with foot in chair 20 sec 2 reps ea LE Standing quad stretch 20 sec 2 reps ea LE Pt verbalized understanding of ongoing exercises  Therapeutic Activities Lifting & carrying 40' X 2 box with 25#, 30#, 40#, 35#, 25# with no noted knee instability Side to side shuffle with squat for 2 minutes Forward & backward shuffle with squat for 2 minutes   TREATMENT DATE:   08/06/23 TherEx Recumbent bike L6 x 5 min Knee extension machine LLE only(occasional assist from RLE) 5# 3x10 (pain minimized range)   TherAct Leg press bil 143# 3x10; LLE only 75# 3x10 Variable lateral shuffle to Blaze Pods (5) 30 sec on/30 sec  rest x 3 cycles Variable forward/backward shuffle to Blaze Pods (5) 30 sec on/30 sec rest x 3 cycles  08/03/23 TherAct Leg press bil 143# 3x10; LLE only 75# 3x10 Squats on ramp with 15# KB 3x10 - cues to decrease Rt lean  TherEx Knee extension machine LLE only 5# 3x10 (pain minimized range) Knee flexion machine LLE only 15# 3x10 Supine hamstring stretch 3x30 sec on Lt Prone quad stretch 3x30 sec on LT    07/09/2023 Therapeutic Exercises: Prone quadriceps stretch (left) with strap 4 x 20 seconds  Functional Activities: Practical lifting from floor for return to work: Diagonal squat lift with 5 and 10 pounds 5 x each Chair squats (just touch glutes to chair then explode up, slow eccentrics) 5 x  Neuromuscular re-education: Y balance Test:  Left 60.5; 96; 77.5 cm Right 67; 106; 97.5 cm Y balance  standing left and reaching right 5 x Bilateral knee jump and soft handing on leg press 81# 15 x Alternating single leg jump (jump left, land right and jump right land left) 30 x slow eccentrics, land softly   07/07/2023  Therapeutic Exercise: Recumbent bike Seat 8 level 8 with 3 minute warm-up then 0:15 sec gentle pedal followed by 0:15 sec sprint, 5 min for 8 min total.  Therapeutic Activities: -Kettle bell squats with 20# 10 x each flat surface and incline up hill & down hill slow eccentrics -Golfer's lift (for work) with [employer] bell 5 x left & 5 x right -walking 60' X 2 carrying 25# box. -moving 25# box 90* between 2 chairs 18" high 10 reps from upper handles and 10 reps from lower handle. -standing midway between 2 18" chairs ~60* angle - moving 25# box between chairs with wide stationary stance 10 reps from upper handles and 10 reps from lower handle. -stairs alternating pattern 11 steps 1st flight light rail support and 2nd flight no rail -power push / pull with pulley weights 50# (25# per stack) walking out / back with weight behind 5 reps & with weight in front 5 reps -power lateral 25# with BUEs on pulley side stepping 5 reps with weight to right and 5 reps with weight to left -PT verbally reviewed building up to work tolerance by setting timer for 1 hour.  Work around house or exercise for 20 min, then rest 40 min for 5 hours (lower of his work time).  Build up to work time similar to work including hours. Some activities may less intense light sweeping but his is on his feet.  Pt verbalized understanding.   Neuromuscular Re-education: -Bil leg jump on leg press 81# 15 x -Jump right, land left 15 x 43# -Jump left, land right 15 x 50#    PATIENT EDUCATION:  Education details: HEP, POC Person educated: Patient Education method: Programmer, multimedia, Demonstration, Verbal cues, and Handouts Education comprehension: verbalized understanding, returned demonstration, and verbal cues  required  HOME EXERCISE PROGRAM: Access Code: [email or web address] URL: [email or web address] Date: 05/31/2023 Prepared by: Vladimir Faster  Exercises - Standing Quad Set  - 3-4 x daily - 7 x weekly - 2-3 sets - 10 reps - 5 seconds hold - Seated Quad Set  - 3-4 x daily - 7 x weekly - 2-3 sets - 10 reps - 5 seconds hold - Ankle Alphabet in Elevation  - 2 x daily - 7 x weekly - 1 sets - >15 min hold - Seated Hamstring Set  - 3 x daily - 7 x weekly - 10 reps - 5  seconds hold - Seated Heel Slide  - 3 x daily - 7 x weekly - 10 reps - 5 seconds hold - standing knee extension  - 1 x daily - 7 x weekly - 4 sets - 30-15 reps - 5 seconds hold   ASSESSMENT: CLINICAL IMPRESSION: Patient met all LTGs.  He appears to have strength and function to return to work.    OBJECTIVE IMPAIRMENTS: Abnormal gait, decreased activity tolerance, decreased balance, decreased knowledge of use of DME, decreased mobility, difficulty walking, decreased ROM, decreased strength, increased edema, increased muscle spasms, and pain.   ACTIVITY LIMITATIONS: carrying, lifting, bending, standing, squatting, stairs, transfers, and locomotion level  PARTICIPATION LIMITATIONS: meal prep, cleaning, laundry, community activity, and occupation  PERSONAL FACTORS: 1-2 comorbidities: see PMH  are also affecting patient's functional outcome.   REHAB POTENTIAL: Good  CLINICAL DECISION MAKING: Stable/uncomplicated  EVALUATION COMPLEXITY: Low   GOALS: Goals reviewed with patient? Yes  SHORT TERM GOALS: (target date for Short term goals 06/02/2022)   1.  Patient will demonstrate independent use of home exercise program to maintain progress from in clinic treatments. Baseline: See objective data Goal status: Met 05/14/2023  2. PROM left knee 0* ext to 100* flexion Baseline: See objective data Goal status: Met 06/25/2023  3. Interim FOTO 50% Baseline: See objective data Goal status:  MET  06/07/2023  LONG TERM GOALS: (target  dates for all long term goals  08/13/2022 )   1. Patient will demonstrate/report pain at worst less than or equal to 2/10 to facilitate minimal limitation in daily activity secondary to pain symptoms. Baseline: See objective data Goal status: Partially Met 07/02/2023   2. Patient will demonstrate independent use of home exercise program to facilitate ability to maintain/progress functional gains from skilled physical therapy services. Baseline: See objective data Goal status: Met 07/09/2023   3. Patient will demonstrate FOTO outcome > or = 69 % to indicate reduced disability due to condition. Baseline: See objective data Goal status: MET 08/16/2023   4.  Patient will demonstrate left LE MMT 5/5 throughout to faciltiate usual transfers, stairs, squatting at [facility] for daily life.  Baseline: See objective data Goal status: MET 08/16/2023   5.  Patient ambulates >500' without device and negotiates ramps, curbs and stairs single rail alternating pattern modified independent.  Baseline: See objective data Goal status: MET 06/15/2023   6.  Left knee AROM 0* - 105* Baseline: See objective data Goal status: Met 06/25/2023   7.  patient demonstrates ability to safely perform work related tasks.  Baseline: See objective data Goal status:   MET 08/16/2023  PLAN:  PT FREQUENCY:  1x/week  PT DURATION: 4-5 weeks  PLANNED INTERVENTIONS: Therapeutic exercises, Therapeutic activity, Neuro Muscular re-education, Balance training, Gait training, Patient/Family education, Joint mobilization, Stair training, DME instructions, Dry Needling, Electrical stimulation, Traction, Cryotherapy, vasopneumatic deviceMoist heat, Taping, Ultrasound, Ionotophoresis 4mg /ml Dexamethasone, and aquatic therapy, Manual therapy.  All included unless contraindicated  PLAN FOR NEXT SESSION:  Discharge PT    Vladimir Faster, PT, DPT 08/16/2023, 3:59 PM

## 2023-08-16 NOTE — Progress Notes (Signed)
 Post-Op Visit Note   Patient: Jesse Howe           Date of Birth: Jun 30, 2005           MRN: [id number] Visit Date: 08/16/2023 PCP: Ronnald Nian, MD   Assessment & Plan:  Chief Complaint:  Chief Complaint  Patient presents with   Left Knee - Routine Post Op      Left knee ACL reconstruction 04/19/2023      Visit Diagnoses:  1. S/P ACL reconstruction     Plan: Patient is a 18 year old male who presents s/p left knee ACL reconstruction on 04/19/2023.  He states that he is doing better than he was at last month's visit.  Feels his knee is getting stronger.  No symptomatic instability or any mechanical symptoms.  He is working with physical therapy and keeping up with home exercise program as well mostly consisting of quadricep strengthening exercises and balance exercises at this point.  He has been able to improve the fatigue that he was having with his left leg at his last visit and he is now able to walk 45 minutes to an hour without any symptomatic fatigue which is much improved compared with the 20 minutes 1 month ago.  On exam, patient has no effusion.  Incisions are well-healed.  ACL graft is stable on Lachman exam and by anterior drawer.  No tenderness over the medial or lateral joint lines.  He has range of motion from 0 degrees extension to 130 degrees of knee flexion.  Excellent quad strength rated 5/5.  Able to perform straight leg raise easily.  Plan is return to work on 08/31/23 to give him a couple more sessions of physical therapy prior to returning to his job which does involve a fair amount of lifting and squatting.  Continue with physical therapy.  Okay for straight ahead jogging at this point but still no cutting/pivoting/jumping/sport activities.  He is okay to do some set shots on a basketball court but advised against any actual pick up or any attempting any lay ups or anything that really involves any jumping.  I think set shots without his feet leaving the  ground should be safe but recommended if he have any increased difficulty, reach out to the office.  Follow-Up Instructions: No follow-ups on file.   Orders:  No orders of the defined types were placed in this encounter.  No orders of the defined types were placed in this encounter.   Imaging: No results found.  PMFS History: Patient Active Problem List   Diagnosis Date Noted   Left anterior cruciate ligament tear 04/25/2023   Rupture of anterior cruciate ligament of right knee    Acute lateral meniscus tear of right knee    Complete tear of right ACL, initial encounter 03/18/2021   Acute medial meniscus tear of right knee 03/18/2021   Abnormal finding on EKG 06/19/2019   Need for prophylactic vaccination and inoculation against influenza 08/09/2018   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   Rhinitis, allergic 02/21/2015   Asthma, moderate persistent 02/21/2015   Encounter for follow-up 02/21/2015   Need for HPV vaccination 02/21/2015   Vaccine counseling 02/21/2015   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, requiring support (level 1) 12/07/2013   Developmental disorder 05/11/2013   Past Medical History:  Diagnosis Date   Allergy    RHINITIS   Asperger syndrome    sees psychiatry and counseling   Asthma    Phreesia 08/16/2020  Developmental disorder    Insomnia     Family History  Problem Relation Age of Onset   Arthritis Paternal Grandmother    Diabetes Paternal Grandmother    Depression Paternal Grandmother    Arthritis Paternal Grandfather    Diabetes Paternal Grandfather    Hypertension Paternal Grandfather    Arthritis Mother        rheumatoid   HIV Maternal Grandfather        Died in his 62's    Past Surgical History:  Procedure Laterality Date   ANTERIOR CRUCIATE LIGAMENT REPAIR Right 04/07/2021   Procedure: RIGHT KNEE RECONSTRUCTION ANTERIOR CRUCIATE LIGAMENT (ACL) WITH QUADRICEPS AUTOGRAFT, LATERAL MENISCAL  DEBRIDEMENT;  Surgeon: Cammy Copa, MD;  Location: [facility];  Service: Orthopedics;  Laterality: Right;   ANTERIOR CRUCIATE LIGAMENT REPAIR Left 04/19/2023   Procedure: LEFT KNEE ARTHROSCOPY ANTERIOR CRUCIATE LIGAMENT (ACL) RECONSTRUCTION WITH QUADRICEP GRAFT;  Surgeon: Cammy Copa, MD;  Location: [facility];  Service: Orthopedics;  Laterality: Left;   TONSILLECTOMY AND ADENOIDECTOMY Bilateral March 2011   Social History   Occupational History   Not on file  Tobacco Use   Smoking status: Never   Smokeless tobacco: Never  Substance and Sexual Activity   Alcohol use: No   Drug use: No   Sexual activity: Not on file

## 2023-10-15 ENCOUNTER — Ambulatory Visit: Payer: MEDICAID | Admitting: Surgical

## 2023-11-12 ENCOUNTER — Ambulatory Visit (INDEPENDENT_AMBULATORY_CARE_PROVIDER_SITE_OTHER): Admitting: Surgical

## 2023-11-12 DIAGNOSIS — Z9889 Other specified postprocedural states: Secondary | ICD-10-CM

## 2023-11-14 ENCOUNTER — Encounter: Payer: Self-pay | Admitting: Surgical

## 2023-11-14 NOTE — Progress Notes (Signed)
 Post-Op Visit Note   Patient: Jesse Howe           Date of Birth: 2005/10/18           MRN: [id number] Visit Date: 11/12/2023 PCP: Watson Hacking, MD   Assessment & Plan:  Chief Complaint:  Chief Complaint  Patient presents with   Left Knee - Follow-up    04/19/2023 left knee ACL reconstruction   Visit Diagnoses:  1. S/P ACL reconstruction     Plan: Patient is a 18 year old male who presents s/p left knee ACL reconstruction on 04/19/2023.  He is doing well.  He has returned back to work at [employer] and full duty.  Not taking any medication for pain.  Only has minimal symptoms primarily at the end of a work shift.  Will occasionally wear a knee brace to work.  He has not been doing any physical therapy recently.  His goal is to return to playing basketball somewhere around a year out from surgery.  On exam, patient has no effusion.  Incisions are well-healed.  ACL graft is stable on Lachman exam and by anterior drawer.  No tenderness over the medial or lateral joint lines.  He has range of motion from 0 degrees extension to >130 degrees of knee flexion.  Good quad strength rated 5/5.  Able to perform straight leg raise easily without extensor lag.  Plan at this time is to continue with home exercise program and since his goal is to return to playing high-level pickup basketball, would recommend that he work with physical therapist prior to returning to sport.  We will plan to set him up for physical therapy evaluation in October for 1 session per week for 6 weeks for a return to sport program including quadricep strength, hamstring strength, plyometric exercises.  Should be okay for open and close chain quad exercises at that point.  Plan to follow-up around mid November for final check and release.  He is okay at this point to start some cutting and pivoting exercises.  Would avoid any sort of pick up or basketball until at least 9 to 10 months out from surgery and optimally would wait  until after he completes physical therapy evaluation.  I think waiting until a year out is a good idea.  Follow-Up Instructions: No follow-ups on file.   Orders:  Orders Placed This Encounter  Procedures   Ambulatory referral to Physical Therapy   No orders of the defined types were placed in this encounter.   Imaging: No results found.  PMFS History: Patient Active Problem List   Diagnosis Date Noted   Left anterior cruciate ligament tear 04/25/2023   Rupture of anterior cruciate ligament of right knee    Acute lateral meniscus tear of right knee    Complete tear of right ACL, initial encounter 03/18/2021   Acute medial meniscus tear of right knee 03/18/2021   Abnormal finding on EKG 06/19/2019   Need for prophylactic vaccination and inoculation against influenza 08/09/2018   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   Rhinitis, allergic 02/21/2015   Asthma, moderate persistent 02/21/2015   Encounter for follow-up 02/21/2015   Need for HPV vaccination 02/21/2015   Vaccine counseling 02/21/2015   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, requiring support (level 1) 12/07/2013   Developmental disorder 05/11/2013   Past Medical History:  Diagnosis Date   Allergy    RHINITIS   Asperger syndrome    sees psychiatry and counseling  Asthma    Phreesia 08/16/2020   Developmental disorder    Insomnia     Family History  Problem Relation Age of Onset   Arthritis Paternal Grandmother    Diabetes Paternal Grandmother    Depression Paternal Grandmother    Arthritis Paternal Grandfather    Diabetes Paternal Grandfather    Hypertension Paternal Grandfather    Arthritis Mother        rheumatoid   HIV Maternal Grandfather        Died in his 27's    Past Surgical History:  Procedure Laterality Date   ANTERIOR CRUCIATE LIGAMENT REPAIR Right 04/07/2021   Procedure: RIGHT KNEE RECONSTRUCTION ANTERIOR CRUCIATE LIGAMENT (ACL) WITH QUADRICEPS  AUTOGRAFT, LATERAL MENISCAL DEBRIDEMENT;  Surgeon: Jasmine Mesi, MD;  Location: [facility];  Service: Orthopedics;  Laterality: Right;   ANTERIOR CRUCIATE LIGAMENT REPAIR Left 04/19/2023   Procedure: LEFT KNEE ARTHROSCOPY ANTERIOR CRUCIATE LIGAMENT (ACL) RECONSTRUCTION WITH QUADRICEP GRAFT;  Surgeon: Jasmine Mesi, MD;  Location: [facility];  Service: Orthopedics;  Laterality: Left;   TONSILLECTOMY AND ADENOIDECTOMY Bilateral March 2011   Social History   Occupational History   Not on file  Tobacco Use   Smoking status: Never   Smokeless tobacco: Never  Substance and Sexual Activity   Alcohol use: No   Drug use: No   Sexual activity: Not on file

## 2023-11-23 ENCOUNTER — Ambulatory Visit
Admission: EM | Admit: 2023-11-23 | Discharge: 2023-11-23 | Disposition: A | Discharge status: Home / Self Care | Attending: Family Medicine | Admitting: Family Medicine

## 2023-11-23 DIAGNOSIS — R070 Pain in throat: Secondary | ICD-10-CM | POA: Diagnosis not present

## 2023-11-23 DIAGNOSIS — J069 Acute upper respiratory infection, unspecified: Secondary | ICD-10-CM | POA: Insufficient documentation

## 2023-11-23 LAB — POCT RAPID STREP A (OFFICE): Rapid Strep A Screen: NEGATIVE

## 2023-11-23 MED ORDER — IBUPROFEN 800 MG PO TABS: 800.0000 mg | ORAL_TABLET | Freq: Three times a day (TID) | ORAL | 0 refills | Status: AC | PRN

## 2023-11-23 NOTE — Discharge Instructions (Addendum)
Your strep test is negative.  Culture of the throat will be sent, and staff will notify you if that is in turn positive.   

## 2023-11-23 NOTE — ED Triage Notes (Signed)
 Sore throat and sneezing x 3 days. Patient states he got overheated yesterday at work and vomited x 1.

## 2023-11-23 NOTE — ED Provider Notes (Signed)
 [facility]    CSN: [id number] Arrival date & time: 11/23/23  1101      History   Chief Complaint Chief Complaint  Patient presents with   Sore Throat   Nasal Congestion    HPI Jesse Howe is a 18 y.o. male.    Sore Throat  Here for sore throat and nasal drainage and vomiting.  Since June 21 he has had some sore throat and pain on swallowing.  He has also had some intermittent rhinorrhea and sneezing in that time.  Just a little cough.  No shortness of breath and no fever or chills.  Yesterday he threw up once and has not really been nauseated.  He notes a little bit of lower abdominal pain, mainly right before he has a bowel movement.  He wonders if he got too hot yesterday at work.  He is allergic to prednisone  which causes stomach ache and hyperactivity and vancomycin  causes a rash    Past Medical History:  Diagnosis Date   Allergy    RHINITIS   Asperger syndrome    sees psychiatry and counseling   Asthma    [employer] 08/16/2020   Developmental disorder    Insomnia     Patient Active Problem List   Diagnosis Date Noted   Left anterior cruciate ligament tear 04/25/2023   Rupture of anterior cruciate ligament of right knee    Acute lateral meniscus tear of right knee    Complete tear of right ACL, initial encounter 03/18/2021   Acute medial meniscus tear of right knee 03/18/2021   Abnormal finding on EKG 06/19/2019   Need for prophylactic vaccination and inoculation against influenza 08/09/2018   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   Rhinitis, allergic 02/21/2015   Asthma, moderate persistent 02/21/2015   Encounter for follow-up 02/21/2015   Need for HPV vaccination 02/21/2015   Vaccine counseling 02/21/2015   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, requiring support (level 1) 12/07/2013   Developmental disorder 05/11/2013    Past Surgical History:  Procedure Laterality Date   ANTERIOR  CRUCIATE LIGAMENT REPAIR Right 04/07/2021   Procedure: RIGHT KNEE RECONSTRUCTION ANTERIOR CRUCIATE LIGAMENT (ACL) WITH QUADRICEPS AUTOGRAFT, LATERAL MENISCAL DEBRIDEMENT;  Surgeon: Addie Cordella Hamilton, MD;  Location: [facility];  Service: Orthopedics;  Laterality: Right;   ANTERIOR CRUCIATE LIGAMENT REPAIR Left 04/19/2023   Procedure: LEFT KNEE ARTHROSCOPY ANTERIOR CRUCIATE LIGAMENT (ACL) RECONSTRUCTION WITH QUADRICEP GRAFT;  Surgeon: Addie Cordella Hamilton, MD;  Location: [facility];  Service: Orthopedics;  Laterality: Left;   TONSILLECTOMY AND ADENOIDECTOMY Bilateral March 2011       Home Medications    Prior to Admission medications   Medication Sig Start Date End Date Taking? Authorizing Provider  ibuprofen  (ADVIL ) 800 MG tablet Take 1 tablet (800 mg total) by mouth every 8 (eight) hours as needed (pain). 11/23/23  Yes Vonna Sharlet POUR, MD  acetaminophen  (TYLENOL ) 500 MG tablet Take 500-1,000 mg by mouth every 6 (six) hours as needed (pain.). Patient not taking: Reported on 11/12/2023    [provider]  albuterol  (PROAIR  HFA) 108 (90 Base) MCG/ACT inhaler Inhale 2 puffs into the lungs every 6 (six) hours as needed for wheezing or shortness of breath. 08/29/21   Birdie Abran Hamilton, DO  albuterol  (PROVENTIL ) (2.5 MG/3ML) 0.083% nebulizer solution Take 3 mLs (2.5 mg total) by nebulization every 6 (six) hours as needed for wheezing or shortness of breath. 08/02/18   Birdie Abran Hamilton, DO  melatonin 5 MG  TABS Take 10 mg by mouth at bedtime as needed (as needed).    [provider]    Family History Family History  Problem Relation Age of Onset   Arthritis Paternal Grandmother    Diabetes Paternal Grandmother    Depression Paternal Grandmother    Arthritis Paternal Grandfather    Diabetes Paternal Grandfather    Hypertension Paternal Grandfather    Arthritis Mother        rheumatoid   HIV Maternal Grandfather        Died in his 71's    Social  History Social History   Tobacco Use   Smoking status: Never   Smokeless tobacco: Never  Substance Use Topics   Alcohol use: No   Drug use: No     Allergies   Prednisone  and Vancomycin    Review of Systems Review of Systems   Physical Exam Triage Vital Signs ED Triage Vitals  Encounter Vitals Group     BP 11/23/23 1125 127/69     Girls Systolic BP Percentile --      Girls Diastolic BP Percentile --      Boys Systolic BP Percentile --      Boys Diastolic BP Percentile --      Pulse Rate 11/23/23 1125 81     Resp 11/23/23 1125 18     Temp 11/23/23 1125 98.2 F (36.8 C)     Temp Source 11/23/23 1125 Oral     SpO2 11/23/23 1125 96 %     Weight --      Height --      Head Circumference --      Peak Flow --      Pain Score 11/23/23 1130 0     Pain Loc --      Pain Education --      Exclude from Growth Chart --    No data found.  Updated Vital Signs BP 127/69 (BP Location: Left Arm)   Pulse 81   Temp 98.2 F (36.8 C) (Oral)   Resp 18   SpO2 96%   Visual Acuity Right Eye Distance:   Left Eye Distance:   Bilateral Distance:    Right Eye Near:   Left Eye Near:    Bilateral Near:     Physical Exam Vitals reviewed.  Constitutional:      General: He is not in acute distress.    Appearance: He is not ill-appearing, toxic-appearing or diaphoretic.  HENT:     Right Ear: Tympanic membrane and ear canal normal.     Left Ear: Tympanic membrane and ear canal normal.     Nose: Congestion present.     Mouth/Throat:     Mouth: Mucous membranes are moist.     Comments: There is some erythema of the oropharynx and clear exudate is draining.  No asymmetry and the uvula is in the midline.  Eyes:     Extraocular Movements: Extraocular movements intact.     Conjunctiva/sclera: Conjunctivae normal.     Pupils: Pupils are equal, round, and reactive to light.    Cardiovascular:     Rate and Rhythm: Normal rate and regular rhythm.     Heart sounds: No murmur  heard. Pulmonary:     Effort: Pulmonary effort is normal. No respiratory distress.     Breath sounds: Normal breath sounds. No stridor. No wheezing, rhonchi or rales.   Musculoskeletal:     Cervical back: Neck supple.  Lymphadenopathy:     Cervical:  No cervical adenopathy.   Skin:    Capillary Refill: Capillary refill takes less than 2 seconds.     Coloration: Skin is not jaundiced or pale.   Neurological:     General: No focal deficit present.     Mental Status: He is alert and oriented to person, place, and time.   Psychiatric:        Behavior: Behavior normal.      UC Treatments / Results  Labs (all labs ordered are listed, but only abnormal results are displayed) Labs Reviewed  POCT RAPID STREP A (OFFICE) - Normal  CULTURE, GROUP A STREP [facility])    EKG   Radiology No results found.  Procedures Procedures (including critical care time)  Medications Ordered in UC Medications - No data to display  Initial Impression / Assessment and Plan / UC Course  I have reviewed the triage vital signs and the nursing notes.  Pertinent labs & imaging results that were available during my care of the patient were reviewed by me and considered in my medical decision making (see chart for details).     Rapid strep is negative.  Throat culture is sent and we will notify and treat protocol if that is positive  Ibuprofen  is sent in for the throat pain.  He can continue to take his over-the-counter allergy medications.  Work note is provided Final Clinical Impressions(s) / UC Diagnoses   Final diagnoses:  Throat pain  Acute upper respiratory infection     Discharge Instructions      Your strep test is negative.  Culture of the throat will be sent, and staff will notify you if that is in turn positive.       ED Prescriptions     Medication Sig Dispense Auth. Provider   ibuprofen  (ADVIL ) 800 MG tablet Take 1 tablet (800 mg total) by mouth every 8 (eight) hours as  needed (pain). 21 tablet Sahirah Rudell K, MD      PDMP not reviewed this encounter.   Vonna Sharlet POUR, MD 11/23/23 [id number]

## 2023-11-25 LAB — CULTURE, GROUP A STREP (THRC)

## 2024-04-03 ENCOUNTER — Encounter: Payer: Self-pay | Admitting: Radiology
